# Patient Record
Sex: Female | Born: 1990 | Race: White | Hispanic: No | Marital: Single | Smoking: Never smoker
Health system: Southern US, Community
[De-identification: ages and names within clinical notes are randomized; demographics above are authoritative.]

## PROBLEM LIST (undated history)

## (undated) DIAGNOSIS — E282 Polycystic ovarian syndrome: Secondary | ICD-10-CM

## (undated) DIAGNOSIS — J45909 Unspecified asthma, uncomplicated: Secondary | ICD-10-CM

## (undated) DIAGNOSIS — Z8619 Personal history of other infectious and parasitic diseases: Secondary | ICD-10-CM

## (undated) DIAGNOSIS — R350 Frequency of micturition: Secondary | ICD-10-CM

## (undated) DIAGNOSIS — F419 Anxiety disorder, unspecified: Secondary | ICD-10-CM

## (undated) DIAGNOSIS — T7840XA Allergy, unspecified, initial encounter: Secondary | ICD-10-CM

## (undated) DIAGNOSIS — R011 Cardiac murmur, unspecified: Secondary | ICD-10-CM

## (undated) DIAGNOSIS — N200 Calculus of kidney: Secondary | ICD-10-CM

## (undated) HISTORY — DX: Cardiac murmur, unspecified: R01.1

## (undated) HISTORY — DX: Calculus of kidney: N20.0

## (undated) HISTORY — PX: TONSILLECTOMY: SUR1361

## (undated) HISTORY — DX: Polycystic ovarian syndrome: E28.2

## (undated) HISTORY — DX: Anxiety disorder, unspecified: F41.9

## (undated) HISTORY — PX: TYMPANOSTOMY TUBE PLACEMENT: SHX32

## (undated) HISTORY — DX: Personal history of other infectious and parasitic diseases: Z86.19

## (undated) HISTORY — DX: Allergy, unspecified, initial encounter: T78.40XA

## (undated) HISTORY — DX: Unspecified asthma, uncomplicated: J45.909

## (undated) HISTORY — DX: Frequency of micturition: R35.0

---
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
## 2011-01-07 ENCOUNTER — Ambulatory Visit: Payer: Managed Care, Other (non HMO) | Admitting: Internal Medicine

## 2011-01-14 ENCOUNTER — Ambulatory Visit (INDEPENDENT_AMBULATORY_CARE_PROVIDER_SITE_OTHER): Payer: Managed Care, Other (non HMO) | Admitting: Internal Medicine

## 2011-01-14 ENCOUNTER — Encounter: Payer: Self-pay | Admitting: Internal Medicine

## 2011-01-14 VITALS — BP 120/80 | HR 60 | Ht 69.0 in | Wt 210.0 lb

## 2011-01-14 DIAGNOSIS — E282 Polycystic ovarian syndrome: Secondary | ICD-10-CM

## 2011-01-14 DIAGNOSIS — Z0000 Encounter for general adult medical examination without abnormal findings: Secondary | ICD-10-CM

## 2011-01-14 DIAGNOSIS — E669 Obesity, unspecified: Secondary | ICD-10-CM

## 2011-01-14 DIAGNOSIS — H019 Unspecified inflammation of eyelid: Secondary | ICD-10-CM

## 2011-01-14 DIAGNOSIS — Z23 Encounter for immunization: Secondary | ICD-10-CM

## 2011-01-14 LAB — CBC WITH DIFFERENTIAL/PLATELET
Basophils Relative: 0.8 % (ref 0.0–3.0)
Eosinophils Absolute: 0.1 10*3/uL (ref 0.0–0.7)
Eosinophils Relative: 1.3 % (ref 0.0–5.0)
Hemoglobin: 13.6 g/dL (ref 12.0–15.0)
Lymphocytes Relative: 48.3 % — ABNORMAL HIGH (ref 12.0–46.0)
MCHC: 33.4 g/dL (ref 30.0–36.0)
MCV: 86.1 fl (ref 78.0–100.0)
Neutro Abs: 1.7 10*3/uL (ref 1.4–7.7)
RBC: 4.72 Mil/uL (ref 3.87–5.11)
WBC: 4 10*3/uL — ABNORMAL LOW (ref 4.5–10.5)

## 2011-01-14 LAB — BASIC METABOLIC PANEL
CO2: 28 mEq/L (ref 19–32)
Calcium: 9.7 mg/dL (ref 8.4–10.5)
Chloride: 106 mEq/L (ref 96–112)
Sodium: 145 mEq/L (ref 135–145)

## 2011-01-14 LAB — HEPATIC FUNCTION PANEL
ALT: 17 U/L (ref 0–35)
Albumin: 4.5 g/dL (ref 3.5–5.2)
Alkaline Phosphatase: 43 U/L (ref 39–117)
Total Protein: 7.9 g/dL (ref 6.0–8.3)

## 2011-01-14 LAB — LIPID PANEL
HDL: 81.7 mg/dL (ref >39.00)
Total CHOL/HDL Ratio: 2

## 2011-01-14 LAB — TSH: TSH: 1.23 u[IU]/mL (ref 0.35–5.50)

## 2011-01-14 NOTE — Progress Notes (Signed)
Subjective:    Patient ID: Caroline Washington, female    DOB: [birth year], 20 y.o.   MRN: [id number]  HPI  Patient comes in today for Preventive Health Care visit  No major change in health status since last visit . Sees Caroline Washington for gyne and hormonal therapy. To see derm for left axillary tags. No injury has new small tatoo on wrist. 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 GI /GU: No adominal pain, vomiting, change in bowel habits. No blood in the stool. No significant GU symptoms. SKIN/HEME: ,no acute skin rashes suspicious lesions or bleeding. No lymphadenopathy, nodules, masses.  NEURO/ PSYCH:  No neurologic signs such as weakness numbness No depression anxiety. IMM/ Allergy: No unusual infections.  Marland Kitchen   REST of 12 system review negative Past Medical History  Diagnosis Date  . History of varicella   . PCO (polycystic ovaries)     hair and irrg periods ? [place]  . Urinary frequency 11/07    no cause  . Calcium oxalate renal stones     If if calcium     History   Social History  . Marital Status: Single    Spouse Name: N/A    Number of Children: N/A  . Years of Education: N/A   Occupational History  . Not on file.   Social History Main Topics  . Smoking status: Never Smoker   . Smokeless tobacco: Not on file  . Alcohol Use: No  . Drug Use: No  . Sexually Active: Not on file   Other Topics Concern  . Not on file   Social History Narrative   Father 5'10'Mother 5'9"Caffeine: 1 per day coke or tea. Sweet teaHH of 32 cats and 2 dogsParents: Caroline Washington and Caroline RonaldSleep 7 hoursUNCG   Sports med .  At home .    Past Surgical History  Procedure Date  . Tympanostomy tube placement     Family History  Problem Relation  Age of Onset  . Hypertension Mother   . Depression Mother   . OCD Mother   . Bipolar disorder Mother   . Hypertension Father     No Known Allergies  Current Outpatient Prescriptions on File Prior to Visit  Medication Sig Dispense Refill  . Norethin-Eth Estradiol-Fe [facility] FE) 0.4-35 MG-MCG CHEW Chew by mouth.          BP 120/80  Pulse 60  Ht 5\' 9"  (1.753 m)  Wt 210 lb (95.255 kg)  BMI 31.01 kg/m2  LMP 01/11/2011       Objective:   Physical Exam Wt Readings from Last 3 Encounters:  01/14/11 210 lb (95.255 kg) (98.15%*)  10/08/10 206 lb (93.441 kg) (97.91%*)  05/20/10 212 lb (96.163 kg) (98.27%*)   * Growth percentiles are based on CDC 2-20 Years data.   Physical Exam: Vital signs reviewed NWG:NFAO is a well-developed well-nourished alert cooperative  white female who appears her stated age in no acute distress.  HEENT: normocephalic  traumatic , Eyes: PERRL EOM's full, conjunctiva clear left upper lid medially with some redness  , Nares: paten,t no deformity discharge or tenderness., Ears: no deformity EAC's clear TMs with normal landmarks. Mouth: clear OP, no lesions, edema.  Moist mucous membranes. Dentition in adequate  repair. NECK: supple without masses, thyromegaly or bruits. CHEST/PULM:  Clear to auscultation and percussion breath sounds equal no wheeze , rales or rhonchi. No chest wall deformities or tenderness. CV: PMI is nondisplaced, S1 S2 no gallops, murmurs, rubs. Peripheral pulses are full without delay.No JVD . Breast: normal by inspection . No dimpling, discharge, masses, tenderness or discharge . LN: no cervical axillary inguinal adenopathy ABDOMEN: Bowel sounds normal nontender  No guard or rebound, no hepato splenomegal no CVA tenderness.  No hernia. Extremtities:  No clubbing cyanosis or edema, no acute joint swelling or redness no focal atrophy NEURO:  Oriented x3, cranial nerves 3-12 appear to be intact, no obvious focal weakness,gait within normal  limits no abnormal reflexes or asymmetrical SKIN: No acute rashes normal turgor, color, no bruising or petechiae. Some mild acne face tiny gland/ bump left axilla  No striae PSYCH: Oriented, good eye contact, no obvious depression anxiety, cognition and judgment appear normal.       Assessment & Plan:  Preventive Health Care Counseled regarding healthy nutrition, exercise, sleep, injury prevention, calcium vit d and healthy weight . Flu shot today advised cpx lab   PCO on ocp per Caroline Washington Skin  Concerns   Axillary tag or cyst Weight up some Counseled.  EYE  Lid use compresses 4 x per day and call if  persistent or progressive

## 2011-01-14 NOTE — Patient Instructions (Signed)
Will notify you  of labs when available. Intensify lifestyle interventions. Get sleep. If ok check ina year or as needed.

## 2011-01-14 NOTE — Assessment & Plan Note (Signed)
Recurred but very local  And comes and goes

## 2011-01-15 ENCOUNTER — Encounter: Payer: Self-pay | Admitting: *Deleted

## 2011-01-15 LAB — HIV ANTIBODY (ROUTINE TESTING W REFLEX): HIV: NONREACTIVE

## 2011-01-15 NOTE — Progress Notes (Signed)
Quick Note:    Letter sent to pt.  ______

## 2013-03-01 ENCOUNTER — Encounter (HOSPITAL_BASED_OUTPATIENT_CLINIC_OR_DEPARTMENT_OTHER): Payer: Self-pay | Admitting: Emergency Medicine

## 2013-03-01 ENCOUNTER — Emergency Department (HOSPITAL_BASED_OUTPATIENT_CLINIC_OR_DEPARTMENT_OTHER): Payer: 59

## 2013-03-01 ENCOUNTER — Emergency Department (HOSPITAL_BASED_OUTPATIENT_CLINIC_OR_DEPARTMENT_OTHER)
Admission: EM | Admit: 2013-03-01 | Discharge: 2013-03-01 | Disposition: A | Discharge status: Home / Self Care | Payer: 59 | Attending: Emergency Medicine | Admitting: Emergency Medicine

## 2013-03-01 DIAGNOSIS — M549 Dorsalgia, unspecified: Secondary | ICD-10-CM | POA: Insufficient documentation

## 2013-03-01 DIAGNOSIS — Z8742 Personal history of other diseases of the female genital tract: Secondary | ICD-10-CM | POA: Insufficient documentation

## 2013-03-01 DIAGNOSIS — Z3202 Encounter for pregnancy test, result negative: Secondary | ICD-10-CM | POA: Insufficient documentation

## 2013-03-01 DIAGNOSIS — Z8619 Personal history of other infectious and parasitic diseases: Secondary | ICD-10-CM | POA: Insufficient documentation

## 2013-03-01 DIAGNOSIS — R109 Unspecified abdominal pain: Secondary | ICD-10-CM | POA: Insufficient documentation

## 2013-03-01 DIAGNOSIS — Z87442 Personal history of urinary calculi: Secondary | ICD-10-CM | POA: Insufficient documentation

## 2013-03-01 LAB — URINALYSIS, ROUTINE W REFLEX MICROSCOPIC
BILIRUBIN URINE: NEGATIVE
GLUCOSE, UA: NEGATIVE mg/dL
KETONES UR: NEGATIVE mg/dL
Leukocytes, UA: NEGATIVE
Nitrite: NEGATIVE
PH: 6 (ref 5.0–8.0)
Protein, ur: NEGATIVE mg/dL
Specific Gravity, Urine: 1.03 (ref 1.005–1.030)
Urobilinogen, UA: 0.2 mg/dL (ref 0.0–1.0)

## 2013-03-01 LAB — URINE MICROSCOPIC-ADD ON

## 2013-03-01 LAB — PREGNANCY, URINE: Preg Test, Ur: NEGATIVE

## 2013-03-01 MED ORDER — OXYCODONE-ACETAMINOPHEN 5-325 MG PO TABS
2.0000 | ORAL_TABLET | Freq: Once | ORAL | Status: AC
  Administered 2013-03-01: 2 via ORAL
  Filled 2013-03-01: qty 2

## 2013-03-01 MED ORDER — ONDANSETRON 4 MG PO TBDP
4.0000 mg | ORAL_TABLET | Freq: Once | ORAL | Status: AC
  Administered 2013-03-01: 4 mg via ORAL
  Filled 2013-03-01: qty 1

## 2013-03-01 MED ORDER — METHOCARBAMOL 500 MG PO TABS: 500.0000 mg | ORAL_TABLET | Freq: Two times a day (BID) | ORAL | Status: DC

## 2013-03-01 MED ORDER — OXYCODONE-ACETAMINOPHEN 5-325 MG PO TABS: 2.0000 | ORAL_TABLET | ORAL | Status: DC | PRN

## 2013-03-01 NOTE — ED Provider Notes (Signed)
CSN: [id number]     Arrival date & time 03/01/13  1714 History   First MD Initiated Contact with Patient 03/01/13 1746     Chief Complaint  Patient presents with  . Flank Pain   (Consider location/radiation/quality/duration/timing/severity/associated sxs/prior Treatment) Patient is a 23 y.o. female presenting with back pain. The history is provided by the patient. No language interpreter was used.  Back Pain Location:  Generalized Quality:  Aching Radiates to:  Does not radiate Pain severity:  Severe Pain is:  Same all the time Onset quality:  Gradual Timing:  Constant Progression:  Worsening Chronicity:  New Relieved by:  Nothing Worsened by:  Nothing tried Associated symptoms: abdominal pain   Pt complains of pain in her back.   Pt reports pain feels like kidney stone she has had in the past  Past Medical History  Diagnosis Date  . History of varicella   . PCO (polycystic ovaries)     hair and irrg periods ? [place]  . Urinary frequency 11/07    no cause  . Calcium oxalate renal stones     If if calcium    Past Surgical History  Procedure Laterality Date  . Tympanostomy tube placement     Family History  Problem Relation Age of Onset  . Hypertension Mother   . Depression Mother   . OCD Mother   . Bipolar disorder Mother   . Hypertension Father    History  Substance Use Topics  . Smoking status: Never Smoker   . Smokeless tobacco: Not on file  . Alcohol Use: No   OB History   Grav Para Term Preterm Abortions TAB SAB Ect Mult Living                 Review of Systems  Gastrointestinal: Positive for abdominal pain.  Musculoskeletal: Positive for back pain.  All other systems reviewed and are negative.    Allergies  Review of patient's allergies indicates no known allergies.  Home Medications   Current Outpatient Rx  Name  Route  Sig  Dispense  Refill  . Norethin-Eth Estradiol-Fe [facility](FEMCON FE) 0.4-35 MG-MCG CHEW   Oral   Chew by mouth.            BP  104/66  Pulse 74  Temp(Src) 97.8 F (36.6 C) (Oral)  Resp 20  Ht 5\' 9"  (1.753 m)  Wt 210 lb (95.255 kg)  BMI 31.00 kg/m2  SpO2 100%  LMP 02/15/2013 Physical Exam  Nursing note and vitals reviewed. Constitutional: She is oriented to person, place, and time.  HENT:  Head: Normocephalic.  Right Ear: External ear normal.  Left Ear: External ear normal.  Mouth/Throat: Oropharynx is clear and moist.  Eyes: Conjunctivae are normal. Pupils are equal, round, and reactive to light.  Neck: Normal range of motion. Neck supple.  Cardiovascular: Normal rate.   Pulmonary/Chest: Effort normal and breath sounds normal.  Abdominal: Soft. Bowel sounds are normal.  Neurological: She is alert and oriented to person, place, and time. She has normal reflexes.  Skin: Skin is warm.  Psychiatric: She has a normal mood and affect.    ED Course  Procedures (including critical care time) Labs Review Labs Reviewed  URINALYSIS, ROUTINE W REFLEX MICROSCOPIC - Abnormal; Notable for the following:    Color, Urine AMBER (*)    Hgb urine dipstick MODERATE (*)    All other components within normal limits  PREGNANCY, URINE  URINE MICROSCOPIC-ADD ON   Imaging Review No results  found.  EKG Interpretation   None       MDM   1. Back pain    Ct reviewed with pt.   I will treat pain.       Lonia Skinner Eskridge, PA-C 03/02/13 1735

## 2013-03-01 NOTE — ED Notes (Signed)
Left flank pain for about a week but worse tonight. Hx of kidney stones. This pain feels the same.

## 2013-03-01 NOTE — Discharge Instructions (Signed)
Back Exercises °Back exercises help treat and prevent back injuries. The goal of back exercises is to increase the strength of your abdominal and back muscles and the flexibility of your back. These exercises should be started when you no longer have back pain. Back exercises include: °· Pelvic Tilt. Lie on your back with your knees bent. Tilt your pelvis until the lower part of your back is against the floor. Hold this position 5 to 10 sec and repeat 5 to 10 times. °· Knee to Chest. Pull first 1 knee up against your chest and hold for 20 to 30 seconds, repeat this with the other knee, and then both knees. This may be done with the other leg straight or bent, whichever feels better. °· Sit-Ups or Curl-Ups. Bend your knees 90 degrees. Start with tilting your pelvis, and do a partial, slow sit-up, lifting your trunk only 30 to 45 degrees off the floor. Take at least 2 to 3 seconds for each sit-up. Do not do sit-ups with your knees out straight. If partial sit-ups are difficult, simply do the above but with only tightening your abdominal muscles and holding it as directed. °· Hip-Lift. Lie on your back with your knees flexed 90 degrees. Push down with your feet and shoulders as you raise your hips a couple inches off the floor; hold for 10 seconds, repeat 5 to 10 times. °· Back arches. Lie on your stomach, propping yourself up on bent elbows. Slowly press on your hands, causing an arch in your low back. Repeat 3 to 5 times. Any initial stiffness and discomfort should lessen with repetition over time. °· Shoulder-Lifts. Lie face down with arms beside your body. Keep hips and torso pressed to floor as you slowly lift your head and shoulders off the floor. °Do not overdo your exercises, especially in the beginning. Exercises may cause you some mild back discomfort which lasts for a few minutes; however, if the pain is more severe, or lasts for more than 15 minutes, do not continue exercises until you see your caregiver.  Improvement with exercise therapy for back problems is slow.  °See your caregivers for assistance with developing a proper back exercise program. °Document Released: 03/06/2004 Document Revised: 04/21/2011 Document Reviewed: 11/28/2010 °ExitCare® Patient Information ©2014 [employer]. ° °Back Pain, Adult °Low back pain is very common. About 1 in 5 people have back pain. The cause of low back pain is rarely dangerous. The pain often gets better over time. About half of people with a sudden onset of back pain feel better in just 2 weeks. About 8 in 10 people feel better by 6 weeks.  °CAUSES °Some common causes of back pain include: °· Strain of the muscles or ligaments supporting the spine. °· Wear and tear (degeneration) of the spinal discs. °· Arthritis. °· Direct injury to the back. °DIAGNOSIS °Most of the time, the direct cause of low back pain is not known. However, back pain can be treated effectively even when the exact cause of the pain is unknown. Answering your caregiver's questions about your overall health and symptoms is one of the most accurate ways to make sure the cause of your pain is not dangerous. If your caregiver needs more information, he or she may order lab work or imaging tests (X-rays or MRIs). However, even if imaging tests show changes in your back, this usually does not require surgery. °HOME CARE INSTRUCTIONS °For many people, back pain returns. Since low back pain is rarely dangerous, it is often a condition that people   can learn to manage on their own.  °· Remain active. It is stressful on the back to sit or stand in one place. Do not sit, drive, or stand in one place for more than 30 minutes at a time. Take short walks on level surfaces as soon as pain allows. Try to increase the length of time you walk each day. °· Do not stay in bed. Resting more than 1 or 2 days can delay your recovery. °· Do not avoid exercise or work. Your body is made to move. It is not dangerous to be active,  even though your back may hurt. Your back will likely heal faster if you return to being active before your pain is gone. °· Pay attention to your body when you  bend and lift. Many people have less discomfort when lifting if they bend their knees, keep the load close to their bodies, and avoid twisting. Often, the most comfortable positions are those that put less stress on your recovering back. °· Find a comfortable position to sleep. Use a firm mattress and lie on your side with your knees slightly bent. If you lie on your back, put a pillow under your knees. °· Only take over-the-counter or prescription medicines as directed by your caregiver. Over-the-counter medicines to reduce pain and inflammation are often the most helpful. Your caregiver may prescribe muscle relaxant drugs. These medicines help dull your pain so you can more quickly return to your normal activities and healthy exercise. °· Put ice on the injured area. °· Put ice in a plastic bag. °· Place a towel between your skin and the bag. °· Leave the ice on for 15-20 minutes, 03-04 times a day for the first 2 to 3 days. After that, ice and heat may be alternated to reduce pain and spasms. °· Ask your caregiver about trying back exercises and gentle massage. This may be of some benefit. °· Avoid feeling anxious or stressed. Stress increases muscle tension and can worsen back pain. It is important to recognize when you are anxious or stressed and learn ways to manage it. Exercise is a great option. °SEEK MEDICAL CARE IF: °· You have pain that is not relieved with rest or medicine. °· You have pain that does not improve in 1 week. °· You have new symptoms. °· You are generally not feeling well. °SEEK IMMEDIATE MEDICAL CARE IF:  °· You have pain that radiates from your back into your legs. °· You develop new bowel or bladder control problems. °· You have unusual weakness or numbness in your arms or legs. °· You develop nausea or vomiting. °· You develop  abdominal pain. °· You feel faint. °Document Released: 01/27/2005 Document Revised: 07/29/2011 Document Reviewed: 06/17/2010 °ExitCare® Patient Information ©2014 [employer]. ° °

## 2013-03-02 ENCOUNTER — Emergency Department (HOSPITAL_COMMUNITY)
Admission: EM | Admit: 2013-03-02 | Discharge: 2013-03-02 | Disposition: A | Discharge status: Home / Self Care | Payer: 59 | Attending: Emergency Medicine | Admitting: Emergency Medicine

## 2013-03-02 ENCOUNTER — Emergency Department (HOSPITAL_COMMUNITY): Payer: 59

## 2013-03-02 ENCOUNTER — Encounter (HOSPITAL_COMMUNITY): Payer: Self-pay | Admitting: Emergency Medicine

## 2013-03-02 DIAGNOSIS — R112 Nausea with vomiting, unspecified: Secondary | ICD-10-CM | POA: Insufficient documentation

## 2013-03-02 DIAGNOSIS — Z8619 Personal history of other infectious and parasitic diseases: Secondary | ICD-10-CM | POA: Insufficient documentation

## 2013-03-02 DIAGNOSIS — R630 Anorexia: Secondary | ICD-10-CM | POA: Insufficient documentation

## 2013-03-02 DIAGNOSIS — Z79899 Other long term (current) drug therapy: Secondary | ICD-10-CM | POA: Insufficient documentation

## 2013-03-02 DIAGNOSIS — Z8742 Personal history of other diseases of the female genital tract: Secondary | ICD-10-CM | POA: Insufficient documentation

## 2013-03-02 DIAGNOSIS — Z87442 Personal history of urinary calculi: Secondary | ICD-10-CM | POA: Insufficient documentation

## 2013-03-02 DIAGNOSIS — E669 Obesity, unspecified: Secondary | ICD-10-CM | POA: Insufficient documentation

## 2013-03-02 DIAGNOSIS — R109 Unspecified abdominal pain: Secondary | ICD-10-CM

## 2013-03-02 LAB — CBC WITH DIFFERENTIAL/PLATELET
BASOS PCT: 0 % (ref 0–1)
Basophils Absolute: 0 10*3/uL (ref 0.0–0.1)
Eosinophils Absolute: 0 10*3/uL (ref 0.0–0.7)
Eosinophils Relative: 0 % (ref 0–5)
HCT: 39.1 % (ref 36.0–46.0)
HEMOGLOBIN: 13 g/dL (ref 12.0–15.0)
LYMPHS ABS: 0.9 10*3/uL (ref 0.7–4.0)
Lymphocytes Relative: 10 % — ABNORMAL LOW (ref 12–46)
MCH: 28.3 pg (ref 26.0–34.0)
MCHC: 33.2 g/dL (ref 30.0–36.0)
MCV: 85 fL (ref 78.0–100.0)
MONOS PCT: 3 % (ref 3–12)
Monocytes Absolute: 0.2 10*3/uL (ref 0.1–1.0)
NEUTROS ABS: 7.4 10*3/uL (ref 1.7–7.7)
NEUTROS PCT: 87 % — AB (ref 43–77)
Platelets: 238 10*3/uL (ref 150–400)
RBC: 4.6 MIL/uL (ref 3.87–5.11)
RDW: 13.9 % (ref 11.5–15.5)
WBC: 8.4 10*3/uL (ref 4.0–10.5)

## 2013-03-02 LAB — BASIC METABOLIC PANEL
BUN: 12 mg/dL (ref 6–23)
CHLORIDE: 102 meq/L (ref 96–112)
CO2: 22 mEq/L (ref 19–32)
Calcium: 9.7 mg/dL (ref 8.4–10.5)
Creatinine, Ser: 0.8 mg/dL (ref 0.50–1.10)
GFR calc non Af Amer: >90 mL/min (ref >90)
Glucose, Bld: 120 mg/dL — ABNORMAL HIGH (ref 70–99)
POTASSIUM: 4.3 meq/L (ref 3.7–5.3)
Sodium: 139 mEq/L (ref 137–147)

## 2013-03-02 MED ORDER — HYDROMORPHONE HCL PF 1 MG/ML IJ SOLN: 0.5000 mg | Freq: Once | INTRAMUSCULAR | Status: DC

## 2013-03-02 MED ORDER — HYDROMORPHONE HCL PF 1 MG/ML IJ SOLN
1.0000 mg | Freq: Once | INTRAMUSCULAR | Status: AC
  Administered 2013-03-02: 1 mg via INTRAVENOUS
  Filled 2013-03-02: qty 1

## 2013-03-02 MED ORDER — MORPHINE SULFATE 4 MG/ML IJ SOLN
4.0000 mg | Freq: Once | INTRAMUSCULAR | Status: AC
  Administered 2013-03-02: 4 mg via INTRAVENOUS
  Filled 2013-03-02: qty 1

## 2013-03-02 MED ORDER — SODIUM CHLORIDE 0.9 % IV BOLUS (SEPSIS)
1000.0000 mL | Freq: Once | INTRAVENOUS | Status: AC
  Administered 2013-03-02: 1000 mL via INTRAVENOUS

## 2013-03-02 MED ORDER — ONDANSETRON HCL 4 MG/2ML IJ SOLN
4.0000 mg | INTRAMUSCULAR | Status: AC
  Administered 2013-03-02: 4 mg via INTRAVENOUS
  Filled 2013-03-02: qty 2

## 2013-03-02 MED ORDER — KETOROLAC TROMETHAMINE 30 MG/ML IJ SOLN
30.0000 mg | Freq: Once | INTRAMUSCULAR | Status: AC
  Administered 2013-03-02: 30 mg via INTRAVENOUS
  Filled 2013-03-02: qty 1

## 2013-03-02 NOTE — ED Provider Notes (Signed)
Patient signed out to me to followup on ultrasound. Patient had been evaluated at [facility] yesterday for left-sided flank pain. Workup is negative, patient prescribed analgesia. Ultrasound of the pelvis with Doppler was performed to rule out ovarian torsion. This test was entirely normal. No adnexal abnormalities including no signs of torsion or ovarian cyst. CT scan performed yesterday was also negative. Cause of the patient's pain is unclear at this time. Blood work entirely normal including normal white count of 8.4.  Patient was reexamined. She is more comfortable but has some residual pain. Discussing the workup and presentation with the patient and her family, this really seems similar to previous kidney stone she has had. CT scan yesterday did show punctate stones in the left kidney. I feel like this is possibly renal colic, without obvious hydronephrosis or ureteral stones seen on the scan. Patient has followup on Monday with her urologist as scheduled.  She has not had any pelvic complaints. As the ultrasound was normal, she does to discharge, I have low suspicion for PID or other abnormalities. I did consider pelvic exam, but after discussing her current presentation with primarily back and left flank pain, have opted to defer the exam.  Gilda Creasehristopher J. Dorianna Mckiver, MD 03/02/13 [phone]

## 2013-03-02 NOTE — ED Notes (Signed)
Patient transported to Ultrasound 

## 2013-03-02 NOTE — ED Provider Notes (Signed)
MSE was initiated and I personally evaluated the patient and placed orders (if any) at  5:32 AM on March 02, 2013.  Patient is a 23 year old female with a history of PCOS and kidney stones who presents today for persistent left flank pain. [facility] with a CT abdomen pelvis and urinalysis as well as urine pregnancy. CT at this time is negative for ureteral calculi. Urine did not suggest infection and urine pregnancy negative. Patient was discharged home with Percocet and Robaxin for symptoms. Patient has been taking medication as prescribed with no improvement in her left flank pain. She is also experienced numerous episodes of nonbloody, nonbilious emesis since discharge yesterday.  On my exam patient has some focal tenderness in the left suprapubic region. No evidence of acute surgical abdomen on exam. She is hemodynamically stable. Given her return and worsening symptoms, not controlled by oral narcotics, will further evaluate for pelvic etiology with pelvic ultrasound; specifically to r/o ovarian torsion and to evaluate for ruptured ovarian cyst. CBC and BMP ordered as well as IV fluids, morphine, and Zofran.  The patient appears stable so that the remainder of the MSE may be completed by another provider.  Antony MaduraKelly Tommy Goostree, PA-C 03/02/13 [phone]

## 2013-03-02 NOTE — ED Provider Notes (Signed)
CSN: [id number]     Arrival date & time 03/02/13  0303 History   First MD Initiated Contact with Patient 03/02/13 0539     Chief Complaint  Patient presents with  . Flank Pain   (Consider location/radiation/quality/duration/timing/severity/associated sxs/prior Treatment) HPI Comments: 23 yo female with PCOS, obesity presents with left flank pain, recurrent for a few days.  Pt was seen at [facility], had CT scan stone study, no acute findings for her pain.  Despite pain meds pain worsening, left lower flank.  No vaginal sxs.  Different than previous.  Sent to [facility] for [place] to look for cyst or torsion.  Nothing improves pain.   Patient is a 23 y.o. female presenting with flank pain. The history is provided by the patient.  Flank Pain Associated symptoms include abdominal pain. Pertinent negatives include no chest pain, no headaches and no shortness of breath.    Past Medical History  Diagnosis Date  . History of varicella   . PCO (polycystic ovaries)     hair and irrg periods ? [place]  . Urinary frequency 11/07    no cause  . Calcium oxalate renal stones     If if calcium    Past Surgical History  Procedure Laterality Date  . Tympanostomy tube placement     Family History  Problem Relation Age of Onset  . Hypertension Mother   . Depression Mother   . OCD Mother   . Bipolar disorder Mother   . Hypertension Father    History  Substance Use Topics  . Smoking status: Never Smoker   . Smokeless tobacco: Not on file  . Alcohol Use: No   OB History   Grav Para Term Preterm Abortions TAB SAB Ect Mult Living                 Review of Systems  Constitutional: Positive for appetite change. Negative for fever and chills.  HENT: Negative for congestion.   Eyes: Negative for visual disturbance.  Respiratory: Negative for shortness of breath.   Cardiovascular: Negative for chest pain.  Gastrointestinal: Positive for nausea, vomiting and abdominal pain.  Genitourinary: Positive for  flank pain. Negative for dysuria and vaginal bleeding.  Musculoskeletal: Negative for back pain, neck pain and neck stiffness.  Skin: Negative for rash.  Neurological: Negative for light-headedness and headaches.    Allergies  Review of patient's allergies indicates no known allergies.  Home Medications   Current Outpatient Rx  Name  Route  Sig  Dispense  Refill  . Norethin-Eth Estrad-Fe [facility] (LO LOESTRIN FE PO)   Oral   Take 1 tablet by mouth daily.         . methocarbamol (ROBAXIN) 500 MG tablet   Oral   Take 1 tablet (500 mg total) by mouth 2 (two) times daily.   20 tablet   0   . oxyCODONE-acetaminophen (PERCOCET/ROXICET) 5-325 MG per tablet   Oral   Take 2 tablets by mouth every 4 (four) hours as needed for severe pain.   15 tablet   0    BP 121/61  Pulse 73  Temp(Src) 98.3 F (36.8 C) (Oral)  Resp 18  Wt 245 lb 8 oz (111.358 kg)  SpO2 99%  LMP 02/15/2013 Physical Exam  Nursing note and vitals reviewed. Constitutional: She is oriented to person, place, and time. She appears well-developed and well-nourished.  HENT:  Head: Normocephalic and atraumatic.  Mild dry mm  Eyes: Conjunctivae are normal. Right eye exhibits no  discharge. Left eye exhibits no discharge.  Neck: Normal range of motion. Neck supple. No tracheal deviation present.  Cardiovascular: Normal rate and regular rhythm.   Pulmonary/Chest: Effort normal and breath sounds normal.  Abdominal: Soft. She exhibits no distension. There is tenderness (left lower quadrant). There is no guarding.  Musculoskeletal: She exhibits no edema.  Neurological: She is alert and oriented to person, place, and time.  Skin: Skin is warm. No rash noted.  Psychiatric: She has a normal mood and affect.    ED Course  Procedures (including critical care time) Labs Review Labs Reviewed  CBC WITH DIFFERENTIAL - Abnormal; Notable for the following:    Neutrophils Relative % 87 (*)    Lymphocytes Relative 10 (*)     All other components within normal limits  BASIC METABOLIC PANEL - Abnormal; Notable for the following:    Glucose, Bld 120 (*)    All other components within normal limits   Imaging Review Ct Abdomen Pelvis Wo Contrast  03/01/2013   CLINICAL DATA:  Left flank pain, history of renal calculi  EXAM: CT ABDOMEN AND PELVIS WITHOUT CONTRAST  TECHNIQUE: Multidetector CT imaging of the abdomen and pelvis was performed following the standard protocol without IV contrast.  COMPARISON:  04/03/2010  FINDINGS: Minor left base atelectasis. Lung bases otherwise clear. No lower lobe pneumonia, collapse or consolidation. Normal heart size. No pericardial or pleural effusion.  Abdomen: Kidneys demonstrate no acute obstruction, hydronephrosis, or perinephric inflammatory process. Punctate subcentimeter nonobstructing intrarenal calculus in the left kidney midpole noted. Ureters are decompressed and symmetric. No definite obstructing ureteral calculus on either side.  Liver, gallbladder, biliary system, pancreas, spleen, and adrenal glands are within normal limits for a noncontrast study.  Negative for bowel obstruction, dilatation, ileus, or free air.  No abdominal free fluid, fluid collection, hemorrhage, abscess, or adenopathy. Normal appendix.  Pelvis: No pelvic free fluid, fluid collection, hemorrhage, abscess, or adenopathy. No inguinal abnormality, or hernia. Urinary bladder is collapsed. The uterus is normal in size. No definite adnexal enlargement or abnormality. Pelvic calcifications consistent with venous phleboliths.  No acute osseous finding.  IMPRESSION: Nonobstructing punctate tiny left intrarenal calculus.  No acute obstructive uropathy, obstructing ureteral calculus or hydronephrosis on either side.  No acute intra-abdominal or pelvic finding by noncontrast CT.   Electronically Signed   By: Ruel Favorsrevor  Shick M.D.   On: 03/01/2013 19:25    EKG Interpretation   None       MDM  No diagnosis found. Fluids,  pain meds given.  UA and labs unremarkable.   Pt unable to have US due to bladder empty, sent back down for more fluids. IV fluids given.  US pending, signed out to fup results and dispo, likely outpt to OB/ GYN. Discussed plan with mother and pt.   Left flank pain, PCOS, Vomiting, Dehydration    Enid SkeensJoshua M Mariko Nowakowski, MD 03/02/13 [phone]

## 2013-03-02 NOTE — ED Provider Notes (Signed)
Medical screening examination/treatment/procedure(s) were conducted as a shared visit with non-physician practitioner(s) or resident  and myself.  I personally evaluated the patient during the encounter and agree with the findings and plan unless otherwise indicated.    I have personally reviewed any xrays and/ or EKG's with the provider and I agree with interpretation.   See separate note.    Enid SkeensJoshua M Ruairi Stutsman, MD 03/02/13 [phone]

## 2013-03-02 NOTE — ED Notes (Signed)
Pt. reports persistent left flank pain with nausea, seen at [facility] this evening discharge home with prescription Percocet and robaxin - CT scan and urine test done at [facility] .

## 2013-03-02 NOTE — Discharge Instructions (Signed)

## 2013-03-04 NOTE — ED Provider Notes (Signed)
Medical screening examination/treatment/procedure(s) were performed by non-physician practitioner and as supervising physician I was immediately available for consultation/collaboration.     Geoffery Lyonsouglas Audra Kagel, MD 03/04/13 1536

## 2013-12-13 ENCOUNTER — Ambulatory Visit (INDEPENDENT_AMBULATORY_CARE_PROVIDER_SITE_OTHER): Payer: 59 | Admitting: Family Medicine

## 2013-12-13 DIAGNOSIS — Z23 Encounter for immunization: Secondary | ICD-10-CM

## 2014-01-27 ENCOUNTER — Ambulatory Visit (INDEPENDENT_AMBULATORY_CARE_PROVIDER_SITE_OTHER): Payer: 59 | Admitting: Physician Assistant

## 2014-01-27 VITALS — BP 118/78 | HR 72 | Temp 98.4°F | Resp 16 | Ht 69.0 in | Wt 254.2 lb

## 2014-01-27 DIAGNOSIS — M545 Low back pain, unspecified: Secondary | ICD-10-CM

## 2014-01-27 DIAGNOSIS — J029 Acute pharyngitis, unspecified: Secondary | ICD-10-CM

## 2014-01-27 LAB — POCT RAPID STREP A (OFFICE): Rapid Strep A Screen: NEGATIVE

## 2014-01-27 MED ORDER — FIRST-DUKES MOUTHWASH MT SUSP: 5.0000 mL | OROMUCOSAL | Status: DC | PRN

## 2014-01-27 MED ORDER — CYCLOBENZAPRINE HCL 5 MG PO TABS: 5.0000 mg | ORAL_TABLET | Freq: Three times a day (TID) | ORAL | Status: DC | PRN

## 2014-01-27 MED ORDER — IBUPROFEN 600 MG PO TABS: 600.0000 mg | ORAL_TABLET | Freq: Three times a day (TID) | ORAL | Status: DC | PRN

## 2014-01-27 NOTE — Patient Instructions (Addendum)
Back Pain, Adult Low back pain is very common. About 1 in 5 people have back pain.The cause of low back pain is rarely dangerous. The pain often gets better over time.About half of people with a sudden onset of back pain feel better in just 2 weeks. About 8 in 10 people feel better by 6 weeks.  CAUSES Some common causes of back pain include:  Strain of the muscles or ligaments supporting the spine.  Wear and tear (degeneration) of the spinal discs.  Arthritis.  Direct injury to the back. DIAGNOSIS Most of the time, the direct cause of low back pain is not known.However, back pain can be treated effectively even when the exact cause of the pain is unknown.Answering your caregiver's questions about your overall health and symptoms is one of the most accurate ways to make sure the cause of your pain is not dangerous. If your caregiver needs more information, he or she may order lab work or imaging tests (X-rays or MRIs).However, even if imaging tests show changes in your back, this usually does not require surgery. HOME CARE INSTRUCTIONS For many people, back pain returns.Since low back pain is rarely dangerous, it is often a condition that people can learn to manageon their own.   Remain active. It is stressful on the back to sit or stand in one place. Do not sit, drive, or stand in one place for more than 30 minutes at a time. Take short walks on level surfaces as soon as pain allows.Try to increase the length of time you walk each day.  Do not stay in bed.Resting more than 1 or 2 days can delay your recovery.  Do not avoid exercise or work.Your body is made to move.It is not dangerous to be active, even though your back may hurt.Your back will likely heal faster if you return to being active before your pain is gone.  Pay attention to your body when you bend and lift. Many people have less discomfortwhen lifting if they bend their knees, keep the load close to their bodies,and  avoid twisting. Often, the most comfortable positions are those that put less stress on your recovering back.  Find a comfortable position to sleep. Use a firm mattress and lie on your side with your knees slightly bent. If you lie on your back, put a pillow under your knees.  Only take over-the-counter or prescription medicines as directed by your caregiver. Over-the-counter medicines to reduce pain and inflammation are often the most helpful.Your caregiver may prescribe muscle relaxant drugs.These medicines help dull your pain so you can more quickly return to your normal activities and healthy exercise.  Put ice on the injured area.  Put ice in a plastic bag.  Place a towel between your skin and the bag.  Leave the ice on for 15-20 minutes, 03-04 times a day for the first 2 to 3 days. After that, ice and heat may be alternated to reduce pain and spasms.  Ask your caregiver about trying back exercises and gentle massage. This may be of some benefit.  Avoid feeling anxious or stressed.Stress increases muscle tension and can worsen back pain.It is important to recognize when you are anxious or stressed and learn ways to manage it.Exercise is a great option. SEEK MEDICAL CARE IF:  You have pain that is not relieved with rest or medicine.  You have pain that does not improve in 1 week.  You have new symptoms.  You are generally not feeling well. SEEK   IMMEDIATE MEDICAL CARE IF:   You have pain that radiates from your back into your legs.  You develop new bowel or bladder control problems.  You have unusual weakness or numbness in your arms or legs.  You develop nausea or vomiting.  You develop abdominal pain.  You feel faint. Document Released: 01/27/2005 Document Revised: 07/29/2011 Document Reviewed: 05/31/2013 ExitCare Patient Information 2015 [employer]. This information is not intended to replace advice given to you by your health care provider. Make sure you  discuss any questions you have with your health care provider.  

## 2014-01-27 NOTE — Progress Notes (Signed)
Subjective:    Patient ID: Caroline PalmaHaley A Asfaw, female    DOB: [birth year], 23 y.o.   MRN: [id number]  HPI Patient presents for 4 days of right lower back pain and sore throat for 1 day. Got a new puppy and has been sleeping on recliner for convenience to taking the puppy outside. Pain does not radiate and is sharp in quality. Was initially 2/10 on pain scale, but is now 6/10. Denies loss of function, ROM, or sensation and no incontinence or urinary/bowel sx. Denies trauma or falls.  Sore throat started last night, but woke up to more intense pain. Had temp of 99.5 degrees. Has swollen swollen nodes and headache. Denies cough, SOB/CP, or congestion. Able to eat without difficulty.   Review of Systems  Constitutional: Positive for fever, appetite change and fatigue. Negative for chills.  HENT: Positive for sore throat. Negative for congestion, ear discharge, ear pain, hearing loss, rhinorrhea and sinus pressure.   Eyes: Negative for pain.  Respiratory: Negative for shortness of breath.   Cardiovascular: Negative for chest pain.  Gastrointestinal: Negative for nausea, vomiting and constipation.  Genitourinary: Negative for dysuria and frequency.  Musculoskeletal: Positive for back pain. Negative for joint swelling, gait problem, neck pain and neck stiffness.  Skin: Negative for rash.  Neurological: Positive for headaches. Negative for light-headedness.  Hematological: Positive for adenopathy.       Objective:   Physical Exam  Constitutional: She is oriented to person, place, and time. She appears well-developed and well-nourished. No distress.  Blood pressure 118/78, pulse 72, temperature 98.4 F (36.9 C), temperature source Oral, resp. rate 16, height 5\' 9"  (1.753 m), weight 254 lb 3.2 oz (115.304 kg), last menstrual period 12/28/2013, SpO2 99 %.  HENT:  Head: Normocephalic and atraumatic.  Right Ear: External ear normal. A foreign body (impacted cerumen) is present.  Left Ear: Tympanic  membrane, external ear and ear canal normal.  Nose: Nose normal. No mucosal edema or rhinorrhea. Right sinus exhibits no maxillary sinus tenderness and no frontal sinus tenderness. Left sinus exhibits no maxillary sinus tenderness and no frontal sinus tenderness.  Mouth/Throat: Uvula is midline and mucous membranes are normal. Posterior oropharyngeal erythema present. No oropharyngeal exudate, posterior oropharyngeal edema or tonsillar abscesses.  2+ hypertrophic tonsils  Eyes: Conjunctivae are normal. Pupils are equal, round, and reactive to light. Right eye exhibits no discharge. Left eye exhibits no discharge. No scleral icterus.  Neck: Normal range of motion. Neck supple. No thyromegaly present.  Cardiovascular: Normal rate, regular rhythm and normal heart sounds.  Exam reveals no gallop and no friction rub.   No murmur heard. Pulmonary/Chest: Effort normal and breath sounds normal. No respiratory distress. She has no wheezes. She has no rales. She exhibits no tenderness.  Abdominal: Soft. Bowel sounds are normal. There is no tenderness.  Musculoskeletal: Normal range of motion. She exhibits no tenderness.  Lymphadenopathy:    She has cervical adenopathy.  Neurological: She is alert and oriented to person, place, and time. She has normal reflexes. No cranial nerve deficit. She exhibits normal muscle tone. Coordination normal.  Skin: Skin is warm and dry. No rash noted. She is not diaphoretic. No erythema. No pallor.   Results for orders placed or performed in visit on 01/27/14  POCT rapid strep A  Result Value Ref Range   Rapid Strep A Screen Negative Negative        Assessment & Plan:  1. Right-sided low back pain without sciatica Alternate ice and heating pad.  -  ibuprofen (ADVIL,MOTRIN) 600 MG tablet; Take 1 tablet (600 mg total) by mouth every 8 (eight) hours as needed.  Dispense: 30 tablet; Refill: 0 - cyclobenzaprine (FLEXERIL) 5 MG tablet; Take 1 tablet (5 mg total) by mouth 3  (three) times daily as needed for muscle spasms.  Dispense: 30 tablet; Refill: 0  2. Sore throat - POCT rapid strep A - Culture, Group A Strep - Diphenhyd-Hydrocort-Nystatin (FIRST-DUKES MOUTHWASH) SUSP; Use as directed 5 mLs in the mouth or throat every 2 (two) hours as needed. Mix with 2% visc. Lido, 1:1 ratio  Dispense: 120 mL; Refill: 0   Braun Rocca PA-C  [facility] 01/27/2014 11:05 AM

## 2014-01-27 NOTE — Progress Notes (Signed)
The patient was discussed with me and I agree with the diagnosis and treatment plan.  

## 2014-01-29 LAB — CULTURE, GROUP A STREP: Organism ID, Bacteria: NORMAL

## 2014-02-01 ENCOUNTER — Encounter: Payer: Self-pay | Admitting: Physician Assistant

## 2014-07-18 ENCOUNTER — Telehealth: Payer: Self-pay | Admitting: Internal Medicine

## 2014-07-18 NOTE — Telephone Encounter (Signed)
Pt not seen by dr Fabian Sharppanosh since 2012.  [place] is flying to [place] on [place]24 and wants t see the dr to get a low dose anxiety med.   Is it ok to schedule this pt w/ dr Fabian Sharppanosh?

## 2014-07-18 NOTE — Telephone Encounter (Signed)
Ok   2 slots   Leave some sdas open.

## 2014-07-19 NOTE — Telephone Encounter (Signed)
done

## 2014-07-31 ENCOUNTER — Ambulatory Visit (INDEPENDENT_AMBULATORY_CARE_PROVIDER_SITE_OTHER): Payer: 59 | Admitting: Internal Medicine

## 2014-07-31 ENCOUNTER — Encounter: Payer: Self-pay | Admitting: Internal Medicine

## 2014-07-31 ENCOUNTER — Ambulatory Visit: Payer: 59 | Admitting: Internal Medicine

## 2014-07-31 VITALS — BP 106/74 | Temp 98.0°F | Ht 68.25 in | Wt 257.2 lb

## 2014-07-31 DIAGNOSIS — Z87442 Personal history of urinary calculi: Secondary | ICD-10-CM | POA: Diagnosis not present

## 2014-07-31 DIAGNOSIS — F418 Other specified anxiety disorders: Secondary | ICD-10-CM | POA: Diagnosis not present

## 2014-07-31 DIAGNOSIS — Z7184 Encounter for health counseling related to travel: Secondary | ICD-10-CM

## 2014-07-31 DIAGNOSIS — Z7189 Other specified counseling: Secondary | ICD-10-CM | POA: Diagnosis not present

## 2014-07-31 DIAGNOSIS — E282 Polycystic ovarian syndrome: Secondary | ICD-10-CM | POA: Diagnosis not present

## 2014-07-31 MED ORDER — LORAZEPAM 1 MG PO TABS: 0.5000 mg | ORAL_TABLET | Freq: Two times a day (BID) | ORAL | Status: DC | PRN

## 2014-07-31 NOTE — Patient Instructions (Signed)
Can take  Medication if needed for anxiety during travel   Do not use with alcohol. Not to be taken on a daily basis . Have a good trip .  Immunization History  Administered Date(s) Administered  . Hep A / Hep B 08/31/2009  . Hepatitis A, Adult 12/13/2013  . Influenza Split 01/14/2011  . Influenza,inj,Quad PF,36+ Mos 12/13/2013  . Meningococcal Polysaccharide 08/31/2009  . Td 08/31/2009

## 2014-07-31 NOTE — Progress Notes (Signed)
Pre visit review using our clinic review tool, if applicable. No additional management support is needed unless otherwise documented below in the visit note.    Chief Complaint  Patient presents with  . Establish Care    HPI: Caroline Washington 24 y.o. comesi n today to reestablish care last seen 2012 .  Since that time she's done fairly well. Couple kidney stones in past . Per Dr Patsi Sears taking citric acid .  Dr Chevis Pretty sees her for GYN and Pap smear getting    pcos    Seeing him once a year .   Is on OCPs periods 3-4 days.  ocass blood test utd on pap . No longer on flexeril  For  Back pain .  that was temporary.  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    On a waiting list for 2 colleges. Going out of country to [place] for 10 days and then to [place] for 3  ..  Flown before  [employer] to [place] and nervous   because the flight is over 8 hours mom suggested about getting a low dose anxiety pill if needed. She doesn't take these and doesn't use significant alcohol would like to have something in case needed.  d.   no history of significant panic attacks otherwise. Flight his overnight.  She states he she is should be up-to-date on immunizations She went to [place] in December.   ROS: See pertinent positives and negatives per HPI. no chest pain shortness of breath major injuries.  Past Medical History  Diagnosis Date  . History of varicella   . PCO (polycystic ovaries)     hair and irrg periods ? [place]  . Urinary frequency 11/07    no cause  . Calcium oxalate renal stones     If if calcium     Family History  Problem Relation Age of Onset  . Hypertension Mother   . Depression Mother   . OCD Mother   . Bipolar disorder Mother   . Hypertension Father     History   Social History  . Marital Status: Single    Spouse Name: N/A  . Number of Children: N/A  . Years of Education: N/A   Social History Main Topics  . Smoking status: Never Smoker   . Smokeless tobacco:  Never Used  . Alcohol Use: No  . Drug Use: No  . Sexual Activity: Not on file   Other Topics Concern  . Not on file   Social History Narrative   Father 81'10'   Mother 5'9"   Caffeine: Coke twice weekly and coffee everyday.   HH of 3   2 cats and 3 dogs   Parents: [name] and Rayvon Char   Sleep 7 hours      [employer] .  At home .   Graduated recently from Kinesiology    Outpatient Prescriptions Prior to Visit  Medication Sig Dispense Refill  . cyclobenzaprine (FLEXERIL) 5 MG tablet Take 1 tablet (5 mg total) by mouth 3 (three) times daily as needed for muscle spasms. 30 tablet 0  . Diphenhyd-Hydrocort-Nystatin (FIRST-DUKES MOUTHWASH) SUSP Use as directed 5 mLs in the mouth or throat every 2 (two) hours as needed. Mix with 2% visc. Lido, 1:1 ratio 120 mL 0  . ibuprofen (ADVIL,MOTRIN) 600 MG tablet Take 1 tablet (600 mg total) by mouth every 8 (eight) hours as needed. 30 tablet 0  . Norethin-Eth Estrad-Fe Biphas (  LO LOESTRIN FE PO) Take 1 tablet by mouth daily.     No facility-administered medications prior to visit.     EXAM:  BP 106/74 mmHg  Temp(Src) 98 F (36.7 C) (Oral)  Ht 5' 8.25" (1.734 m)  Wt 257 lb 3.2 oz (116.665 kg)  BMI 38.80 kg/m2  LMP 07/28/2014 (Within Days)  Body mass index is 38.8 kg/(m^2).  GENERAL: vitals reviewed and listed above, alert, oriented, appears well hydrated and in no acute distress HEENT: atraumatic, conjunctiva  clear, no obvious abnormalities on inspection of external nose and ears NECK: no obvious masses on inspection palpation  LUNGS: clear to auscultation bilaterally, no wheezes, rales or rhonchi, good air movement CV: HRRR, no clubbing cyanosis or  peripheral edema nl cap refill  MS: moves all extremities without noticeable focal  abnormality PSYCH: pleasant and cooperative, no obvious depression or anxiety  ASSESSMENT AND PLAN:  Discussed the following assessment and plan:  No diagnosis found.  -Patient advised  to return or notify health care team  if symptoms worsen ,persist or new concerns arise.  Patient Instructions   Can take  Medication if needed for anxiety during travel   Do not use with alcohol. Not to be taken on a daily basis . Have a good trip .  Immunization History  Administered Date(s) Administered  . Hep A / Hep B 08/31/2009  . Hepatitis A, Adult 12/13/2013  . Influenza Split 01/14/2011  . Influenza,inj,Quad PF,36+ Mos 12/13/2013  . Meningococcal Polysaccharide 08/31/2009  . Td 08/31/2009          Neta Mends. Syvanna Ciolino M.D.

## 2014-11-02 ENCOUNTER — Encounter (INDEPENDENT_AMBULATORY_CARE_PROVIDER_SITE_OTHER): Payer: 59 | Admitting: Internal Medicine

## 2014-11-02 DIAGNOSIS — Z0000 Encounter for general adult medical examination without abnormal findings: Secondary | ICD-10-CM | POA: Diagnosis not present

## 2014-11-02 LAB — CBC WITH DIFFERENTIAL/PLATELET
BASOS ABS: 0 10*3/uL (ref 0.0–0.1)
BASOS PCT: 0.6 % (ref 0.0–3.0)
EOS ABS: 0.1 10*3/uL (ref 0.0–0.7)
Eosinophils Relative: 1.2 % (ref 0.0–5.0)
HEMATOCRIT: 37.9 % (ref 36.0–46.0)
Hemoglobin: 12.6 g/dL (ref 12.0–15.0)
LYMPHS PCT: 34.5 % (ref 12.0–46.0)
Lymphs Abs: 2.2 10*3/uL (ref 0.7–4.0)
MCHC: 33.3 g/dL (ref 30.0–36.0)
MCV: 81.9 fl (ref 78.0–100.0)
MONO ABS: 0.4 10*3/uL (ref 0.1–1.0)
Monocytes Relative: 6.7 % (ref 3.0–12.0)
NEUTROS ABS: 3.7 10*3/uL (ref 1.4–7.7)
Neutrophils Relative %: 57 % (ref 43.0–77.0)
PLATELETS: 218 10*3/uL (ref 150.0–400.0)
RBC: 4.62 Mil/uL (ref 3.87–5.11)
RDW: 14.4 % (ref 11.5–15.5)
WBC: 6.4 10*3/uL (ref 4.0–10.5)

## 2014-11-02 LAB — BASIC METABOLIC PANEL
BUN: 13 mg/dL (ref 6–23)
CHLORIDE: 105 meq/L (ref 96–112)
CO2: 26 meq/L (ref 19–32)
CREATININE: 0.65 mg/dL (ref 0.40–1.20)
Calcium: 9.4 mg/dL (ref 8.4–10.5)
GFR: 119.89 mL/min (ref >60.00)
Glucose, Bld: 86 mg/dL (ref 70–99)
POTASSIUM: 4.4 meq/L (ref 3.5–5.1)
Sodium: 139 mEq/L (ref 135–145)

## 2014-11-02 LAB — HEPATIC FUNCTION PANEL
ALT: 13 U/L (ref 0–35)
AST: 12 U/L (ref 0–37)
Albumin: 4.3 g/dL (ref 3.5–5.2)
Alkaline Phosphatase: 47 U/L (ref 39–117)
BILIRUBIN DIRECT: 0.1 mg/dL (ref 0.0–0.3)
BILIRUBIN TOTAL: 0.4 mg/dL (ref 0.2–1.2)
TOTAL PROTEIN: 7 g/dL (ref 6.0–8.3)

## 2014-11-02 LAB — LIPID PANEL
Cholesterol: 149 mg/dL (ref 0–200)
HDL: 54.1 mg/dL (ref >39.00)
LDL Cholesterol: 79 mg/dL (ref 0–99)
NonHDL: 94.74
TRIGLYCERIDES: 77 mg/dL (ref 0.0–149.0)
Total CHOL/HDL Ratio: 3
VLDL: 15.4 mg/dL (ref 0.0–40.0)

## 2014-11-02 LAB — TSH: TSH: 2.06 u[IU]/mL (ref 0.35–4.50)

## 2014-11-02 NOTE — Progress Notes (Signed)
Document opened and reviewed for cpx  but appt  canceled same day .pateint late for appt

## 2014-11-13 ENCOUNTER — Ambulatory Visit (INDEPENDENT_AMBULATORY_CARE_PROVIDER_SITE_OTHER): Payer: 59 | Admitting: Internal Medicine

## 2014-11-13 ENCOUNTER — Encounter: Payer: Self-pay | Admitting: Internal Medicine

## 2014-11-13 VITALS — BP 130/80 | Temp 98.1°F | Ht 69.5 in | Wt 253.1 lb

## 2014-11-13 DIAGNOSIS — Z111 Encounter for screening for respiratory tuberculosis: Secondary | ICD-10-CM

## 2014-11-13 DIAGNOSIS — Z3041 Encounter for surveillance of contraceptive pills: Secondary | ICD-10-CM

## 2014-11-13 DIAGNOSIS — Z0000 Encounter for general adult medical examination without abnormal findings: Secondary | ICD-10-CM | POA: Diagnosis not present

## 2014-11-13 DIAGNOSIS — Z23 Encounter for immunization: Secondary | ICD-10-CM

## 2014-11-13 NOTE — Progress Notes (Signed)
Pre visit review using our clinic review tool, if applicable. No additional management support is needed unless otherwise documented below in the visit note.  Chief Complaint  Patient presents with  . Annual Exam    HPI: Patient  Caroline Washington  24 y.o. comes in today for [place] visit  Has form for [employer]  To go to PT school . Grad [employer] . working ft Artist . 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   Last pap this year . Health Maintenance  Topic Date Due  . TETANUS/TDAP  09/04/2010  . INFLUENZA VACCINE  09/11/2014  . PAP SMEAR  07/11/2016  . HIV Screening  Completed   Health Maintenance Review LIFESTYLE:  Exercise:  At the gym 3 x per week  weight going down  Tobacco/ETS:n Alcohol: per day rare Sugar beverages:no Sleep: 7-8  Drug use: no   ROS:  GEN/ HEENT: No fever, significant weight changes sweats headaches vision problems hearing changes, CV/ PULM; No chest pain shortness of breath cough, syncope,edema  change in exercise tolerance. GI /GU: No adominal pain, vomiting, change in bowel habits. No blood in the stool. No significant GU symptoms. SKIN/HEME: ,no acute skin rashes suspicious lesions or bleeding. No lymphadenopathy, nodules, masses.  NEURO/ PSYCH:  No neurologic signs such as weakness numbness. No depression anxiety. IMM/ Allergy: No unusual infections.  Allergy .   REST of 12 system review negative except as per HPI   Past Medical History  Diagnosis Date  . History of varicella   . PCO (polycystic ovaries)     hair and irrg periods ? [place]  . Urinary frequency 11/07    no cause  . Calcium oxalate renal stones     If if calcium     Past Surgical History  Procedure Laterality Date  . Tympanostomy tube placement      Family History  Problem Relation Age of Onset  . Hypertension Mother   . Depression Mother   . OCD Mother   . Bipolar disorder Mother   . Hypertension Father     Social History   Social History    . Marital Status: Single    Spouse Name: N/A  . Number of Children: N/A  . Years of Education: N/A   Social History Main Topics  . Smoking status: Never Smoker   . Smokeless tobacco: Never Used  . Alcohol Use: No  . Drug Use: No  . Sexual Activity: Not Asked   Other Topics Concern  . None   Social History Narrative   Father 27'10'   Mother 5'9"   Caffeine: Coke twice weekly and coffee everyday.   HH of 3   2 cats and 3 dogs   Parents: Ivin Booty and Harrington Challenger   Sleep 7 hours      [facility] med .     Graduated recently from [place] into PT school    Outpatient Prescriptions Prior to Visit  Medication Sig Dispense Refill  . GILDESS FE 1/20 1-20 MG-MCG tablet Take 1 tablet by mouth daily.  1  . Sod Citrate-Citric Acid (CYTRA-2) 500-334 MG/5ML SOLN     . LORazepam (ATIVAN) 1 MG tablet Take 0.5-1 tablets (0.5-1 mg total) by mouth 2 (two) times daily as needed for anxiety. 10 tablet 0   No facility-administered medications prior to visit.     EXAM:  BP 130/80 mmHg  Temp(Src) 98.1 F (36.7  C) (Oral)  Ht 5' 9.5" (1.765 m)  Wt 253 lb 1.6 oz (114.805 kg)  BMI 36.85 kg/m2  Body mass index is 36.85 kg/(m^2).  Physical Exam: Vital signs reviewed TUU:EKCM is a well-developed well-nourished alert cooperative    who appearsr stated age in no acute distress.  HEENT: normocephalic atraumatic , Eyes: PERRL EOM's full, conjunctiva clear, Nares: paten,t no deformity discharge or tenderness., Ears: no deformity EAC's clear TMs with normal landmarks. Mouth: clear OP, no lesions, edema.  Moist mucous membranes. Dentition in adequate repair. Braces  Tonsils 2+  NECK: supple without masses, thyromegaly or bruits. CHEST/PULM:  Clear to auscultation and percussion breath sounds equal no wheeze , rales or rhonchi. No chest wall deformities or tenderness.Breast: normal by inspection . No dimpling, discharge, masses, tenderness or discharge . CV: PMI is nondisplaced, S1  S2 no gallops, murmurs, rubs. Peripheral pulses are full without delay.No JVD .  ABDOMEN: Bowel sounds normal nontender  No guard or rebound, no hepato splenomegal no CVA tenderness.  No hernia. Extremtities:  No clubbing cyanosis or edema, no acute joint swelling or redness no focal atrophy NEURO:  Oriented x3, cranial nerves 3-12 appear to be intact, no obvious focal weakness,gait within normal limits no abnormal reflexes or asymmetrical SKIN: No acute rashes normal turgor, color, no bruising or petechiae.nl hair  No striae  PSYCH: Oriented, good eye contact, no obvious depression anxiety, cognition and judgment appear normal. LN: no cervical axillary inguinal adenopathy  Lab Results  Component Value Date   WBC 6.4 11/02/2014   HGB 12.6 11/02/2014   HCT 37.9 11/02/2014   PLT 218.0 11/02/2014   GLUCOSE 86 11/02/2014   CHOL 149 11/02/2014   TRIG 77.0 11/02/2014   HDL 54.10 11/02/2014   LDLCALC 79 11/02/2014   ALT 13 11/02/2014   AST 12 11/02/2014   NA 139 11/02/2014   K 4.4 11/02/2014   CL 105 11/02/2014   CREATININE 0.65 11/02/2014   BUN 13 11/02/2014   CO2 26 11/02/2014   TSH 2.06 11/02/2014   Wt Readings from Last 3 Encounters:  11/13/14 253 lb 1.6 oz (114.805 kg)  07/31/14 257 lb 3.2 oz (116.665 kg)  01/27/14 254 lb 3.2 oz (115.304 kg)   BP Readings from Last 3 Encounters:  11/13/14 130/80  07/31/14 106/74  01/27/14 118/78     ASSESSMENT AND PLAN:  Discussed the following assessment and plan:  Visit for preventive health examination  Screening-pulmonary TB - Plan: PPD  Oral contraceptive use - for pcos  Form completed get copy of immuniz resgistry [employer] or prev hs  By hx is UTD .  Except flu vaccine  Today Had varicella vaccine and disease  Labs reviewed  Patient Care Team: Burnis Medin, MD as PCP - General Delila Pereyra, MD (Obstetrics and Gynecology) dermatology Dene Gentry, MD as Consulting Physician (Sports Medicine) Carolan Clines, MD as  Consulting Physician (Urology) Patient Instructions        Health Maintenance - 72-33 Years [place] After high school, you may attend college or technical or vocational school, enroll in the [employer], or enter the workforce. PHYSICAL, SOCIAL, AND EMOTIONAL DEVELOPMENT  One hour of regular physical activity daily is recommended. Continue to participate in sports.  Develop your own interests and consider community service or volunteerism.  Make decisions about college and work plans.  Throughout these years, you should assume responsibility for your own health care. Increasing independence is important for you.  You may be exploring your  sexual identity. Understand that you should never be in a situation that makes you feel uncomfortable, and tell your partner if you do not want to engage in sexual activity.  Body image may become important to you. Be mindful that eating disorders can develop at this time. Talk to your parents or other caregivers if you have concerns about body image, weight gain, or losing weight.  You may notice mood disturbances, depression, anxiety, attention problems, or trouble with alcohol. Talk to your health care provider if you have concerns about mental illness.  Set limits for yourself and talk with your parents or other caregivers about independent decision making.  Handle conflict without physical violence.  Avoid loud noises which may impair hearing.  Limit television and computer time to 2 hours each day. Individuals who engage in excessive inactivity are more likely to become overweight. RECOMMENDED IMMUNIZATIONS  Influenza vaccine.  All adults should be immunized every year.  All adults, including pregnant women and people with hives-only allergy to eggs, can receive the inactivated influenza (IIV) vaccine.  Adults aged 18-49 years can receive the recombinant influenza (RIV) vaccine. The RIV vaccine does not contain any egg  protein.  Tetanus, diphtheria, and acellular pertussis (Td, Tdap) vaccine.  Pregnant women should receive 1 dose of Tdap vaccine during each pregnancy. The dose should be obtained regardless of the length of time since the last dose. Immunization is preferred during the 27th to 36th week of gestation.  An adult who has not previously received Tdap or who does not know his or her vaccine status should receive 1 dose of Tdap. This initial dose should be followed by tetanus and diphtheria toxoids (Td) booster doses every 10 years.  Adults with an unknown or incomplete history of completing a 3-dose immunization series with Td-containing vaccines should begin or complete a primary immunization series including a Tdap dose.  Adults should receive a Td booster every 10 years.  Varicella vaccine.  An adult without evidence of immunity to varicella should receive 2 doses or a second dose if he or she has previously received 1 dose.  Pregnant females who do not have evidence of immunity should receive the first dose after pregnancy. This first dose should be obtained before leaving the health care facility. The second dose should be obtained 4-8 weeks after the first dose.  Human papillomavirus (HPV) vaccine.  Females aged 13-26 years who have not received the vaccine previously should obtain the 3-dose series.  The vaccine is not recommended for pregnant females. However, pregnancy testing is not needed before receiving a dose. If a female is found to be pregnant after receiving a dose, no treatment is needed. In that case, the remaining doses should be delayed until after the pregnancy.  Males aged 73-21 years who have not received the vaccine previously should receive the 3-dose series. Males aged 22-26 years may be immunized.  Immunization is recommended through the age of 48 years for any female who has sex with males and did not get any or all doses earlier.  Immunization is recommended for  any person with an immunocompromised condition through the age of 90 years if he or she did not get any or all doses earlier.  During the 3-dose series, the second dose should be obtained 4-8 weeks after the first dose. The third dose should be obtained 24 weeks after the first dose and 16 weeks after the second dose.  Measles, mumps, and rubella (MMR) vaccine.  Adults born in  1956 or later should have 1 or more doses of MMR vaccine unless there is a contraindication to the vaccine or there is laboratory evidence of immunity to each of the three diseases.  A routine second dose of MMR vaccine should be obtained at least 28 days after the first dose for students attending postsecondary schools, health care workers, and international travelers.  For females of childbearing age, rubella immunity should be determined. If there is no evidence of immunity, females who are not pregnant should be vaccinated. If there is no evidence of immunity, females who are pregnant should delay immunization until after pregnancy.  Pneumococcal 13-valent conjugate (PCV13) vaccine.  When indicated, a person who is uncertain of his or her immunization history and has no record of immunization should receive the PCV13 vaccine.  An adult aged 16 years or older who has certain medical conditions and has not been previously immunized should receive 1 dose of PCV13 vaccine. This PCV13 should be followed with a dose of pneumococcal polysaccharide (PPSV23) vaccine. The PPSV23 vaccine dose should be obtained at least 8 weeks after the dose of PCV13 vaccine.  An adult aged 56 years or older who has certain medical conditions and previously received 1 or more doses of PPSV23 vaccine should receive 1 dose of PCV13. The PCV13 vaccine dose should be obtained 1 or more years after the last PPSV23 vaccine dose.  Pneumococcal polysaccharide (PPSV23) vaccine.  When PCV13 is also indicated, PCV13 should be obtained first.  An adult  younger than age 27 years who has certain medical conditions should be immunized.  Any person who resides in a long-term care facility should be immunized.  An adult smoker should be immunized.  People with an immunocompromised condition and certain other conditions should receive both PCV13 and PPSV23 vaccines.  People with human immunodeficiency virus (HIV) infection should be immunized as soon as possible after diagnosis.  Immunization during chemotherapy or radiation therapy should be avoided.  Routine use of PPSV23 vaccine is not recommended for American Indians, [place] Natives, or people younger than 65 years unless there are medical conditions that require PPSV23 vaccine.  When indicated, people who have unknown immunization and have no record of immunization should receive PPSV23 vaccine.  One-time revaccination 5 years after the first dose of PPSV23 is recommended for people aged 19-64 years who have chronic kidney failure, nephrotic syndrome, asplenia, or immunocompromised conditions.  Meningococcal vaccine.  Adults with asplenia or persistent complement component deficiencies should receive 2 doses of quadrivalent meningococcal conjugate (MenACWY-D) vaccine. The doses should be obtained at least 2 months apart.  Microbiologists working with certain meningococcal bacteria, [place] recruits, people at risk during an outbreak, and people who travel to or live in countries with a high rate of meningitis should be immunized.  A first-year college student up through age 107 years who is living in a residence hall should receive a dose if he or she did not receive a dose on or after his or her 16th birthday.  Adults who have certain high-risk conditions should receive one or more doses of vaccine.  Hepatitis A vaccine.  Adults who wish to be protected from this disease, have certain high-risk conditions, work with hepatitis A-infected animals, work in hepatitis A research labs, or  travel to or work in countries with a high rate of hepatitis A should be immunized.  Adults who were previously unvaccinated and who anticipate close contact with an international adoptee during the first 60 days after arrival in  the [place] from a country with a high rate of hepatitis A should be immunized.  Hepatitis B vaccine.  Adults who wish to be protected from this disease, have certain high-risk conditions, may be exposed to blood or other infectious body fluids, are household contacts or sex partners of hepatitis B positive people, are clients or workers in certain care facilities, or travel to or work in countries with a high rate of hepatitis B should be immunized.  Haemophilus influenzae type b (Hib) vaccine.  A previously unvaccinated person with asplenia or sickle cell disease or having a scheduled splenectomy should receive 1 dose of Hib vaccine.  Regardless of previous immunization, a recipient of a hematopoietic stem cell transplant should receive a 3-dose series 6-12 months after his or her successful transplant.  Hib vaccine is not recommended for adults with HIV infection. TESTING  Annual screening for vision and hearing problems is recommended. Vision should be screened at least once between 20-45 years of age.  You may be screened for anemia or tuberculosis.  You should have a blood test to check for high cholesterol.  You should be screened for alcohol and drug use.  If you are sexually active, you may be screened for sexually transmitted infections (STIs), pregnancy, or HIV. You should be screened for STIs if:  Your sexual activity has changed since the last screening test, and you are at an increased risk for chlamydia or gonorrhea. Ask your health care provider if you are at risk.  If you are at an increased risk for hepatitis B, you should be screened for this virus. You are considered at high risk for hepatitis B if you:  Were born in a country where  hepatitis B occurs often. Talk with your health care provider about which countries are considered high risk.  Have parents who were born in a high-risk country and have not received a shot to protect against hepatitis B (hepatitis B vaccine).  Have HIV or AIDS.  Use needles to inject street drugs.  Live with or have sex with someone who has hepatitis B.  Are a man who has sex with other men (MSM).  Get hemodialysis treatment.  Take certain medicines for conditions like cancer, organ transplantation, or autoimmune conditions. NUTRITION   You should:  Have three servings of low-fat milk and dairy products daily. If you do not drink milk or consume dairy products, you should eat calcium-enriched foods, such as juice, bread, or cereal. Dark, leafy greens or canned fish are alternate sources of calcium.  Drink plenty of water. Fruit juice should be limited to 8-12 oz (240-360 mL) each day. Sugary beverages and sodas should be avoided.  Avoid eating foods high in fat, salt, or sugar, such as chips, candy, and cookies.  Avoid fast foods and limit eating out at restaurants.  Try not to skip meals, especially breakfast. You should eat a variety of vegetables, fruits, and lean meats.  Eat meals together as a family whenever possible. ORAL HEALTH Brush your teeth twice a day and floss at least once a day. You should have two dental exams a year.  SKIN CARE You should wear sunscreen when out in the sun. TALK TO SOMEONE ABOUT:  Precautions against pregnancy, contraception, and sexually transmitted infections.  Taking a prescription medicine daily to prevent HIV infection if you are at risk of being infected with HIV. This is called preexposure prophylaxis (PrEP). You are at risk if you:  Are a female who has  sex with other males (MSM).  Are heterosexual and sexually active with more than one partner.  Take drugs by injection.  Are sexually active with a partner who has HIV.  Whether  you are at high risk of being infected with HIV. If you choose to begin PrEP, you should first be tested for HIV. You should then be tested every 3 months for as long as you are taking PrEP.  Drug, tobacco, and alcohol use among your friends or at friends' homes. Smoking tobacco or marijuana and taking drugs have health consequences and may impact your brain development.  Appropriate use of over-the-counter or prescription medicines.  Driving guidelines and riding with friends.  The risks of drinking and driving or boating. Call someone if you have been drinking or using drugs and need a ride. WHAT'S NEXT? Visit your pediatrician or family physician once a year. By young adulthood, you should transition from your pediatrician to a family physician or internal medicine specialist. If you are a female and are sexually active, you may want to begin annual physical exams with a gynecologist. Document Released: 04/24/2006 Document Revised: 02/01/2013 Document Reviewed: 05/14/2006 [facility] Patient Information 2015 [place], [place]. This information is not intended to replace advice given to you by your health care provider. Make sure you discuss any questions you have with your health care provider.     Why follow it? Research shows. . Those who follow the Mediterranean diet have a reduced risk of heart disease  . The diet is associated with a reduced incidence of Parkinson's and Alzheimer's diseases . People following the diet may have longer life expectancies and lower rates of chronic diseases  . The Dietary Guidelines for Americans recommends the Mediterranean diet as an eating plan to promote health and prevent disease  What Is the Mediterranean Diet?  . Healthy eating plan based on typical foods and recipes of Mediterranean-style cooking . The diet is primarily a plant based diet; these foods should make up a majority of meals   Starches - Plant based foods should make up a majority of  meals - They are an important sources of vitamins, minerals, energy, antioxidants, and fiber - Choose whole grains, foods high in fiber and minimally processed items  - Typical grain sources include wheat, oats, barley, corn, brown rice, bulgar, farro, millet, polenta, couscous  - Various types of beans include chickpeas, lentils, fava beans, black beans, white beans   Fruits  Veggies - Large quantities of antioxidant rich fruits & veggies; 6 or more servings  - Vegetables can be eaten raw or lightly drizzled with oil and cooked  - Vegetables common to the traditional Mediterranean Diet include: artichokes, arugula, beets, broccoli, brussel sprouts, cabbage, carrots, celery, collard greens, cucumbers, eggplant, kale, leeks, lemons, lettuce, mushrooms, okra, onions, peas, peppers, potatoes, pumpkin, radishes, rutabaga, shallots, spinach, sweet potatoes, turnips, zucchini - Fruits common to the Mediterranean Diet include: apples, apricots, avocados, cherries, clementines, dates, figs, grapefruits, grapes, melons, nectarines, oranges, peaches, pears, pomegranates, strawberries, tangerines  Fats - Replace butter and margarine with healthy oils, such as olive oil, canola oil, and tahini  - Limit nuts to no more than a handful a day  - Nuts include walnuts, almonds, pecans, pistachios, pine nuts  - Limit or avoid candied, honey roasted or heavily salted nuts - Olives are central to the Mediterranean diet - can be eaten whole or used in a variety of dishes   Meats Protein - Limiting red meat: no more than a  few times a month - When eating red meat: choose lean cuts and keep the portion to the size of deck of cards - Eggs: approx. 0 to 4 times a week  - Fish and lean poultry: at least 2 a week  - Healthy protein sources include, chicken, [place], lean beef, lamb - Increase intake of seafood such as tuna, salmon, trout, mackerel, shrimp, scallops - Avoid or limit high fat processed meats such as sausage  and bacon  Dairy - Include moderate amounts of low fat dairy products  - Focus on healthy dairy such as fat free yogurt, skim milk, low or reduced fat cheese - Limit dairy products higher in fat such as whole or 2% milk, cheese, ice cream  Alcohol - Moderate amounts of red wine is ok  - No more than 5 oz daily for women (all ages) and men older than age 54  - No more than 10 oz of wine daily for men younger than 68  Other - Limit sweets and other desserts  - Use herbs and spices instead of salt to flavor foods  - Herbs and spices common to the traditional Mediterranean Diet include: basil, bay leaves, chives, cloves, cumin, fennel, garlic, lavender, marjoram, mint, oregano, parsley, pepper, rosemary, sage, savory, sumac, tarragon, thyme   It's not just a diet, it's a lifestyle:  . The Mediterranean diet includes lifestyle factors typical of those in the region  . Foods, drinks and meals are best eaten with others and savored . Daily physical activity is important for overall good health . This could be strenuous exercise like running and aerobics . This could also be more leisurely activities such as walking, housework, yard-work, or taking the stairs . Moderation is the key; a balanced and healthy diet accommodates most foods and drinks . Consider portion sizes and frequency of consumption of certain foods   Meal Ideas & Options:  . Breakfast:  o Whole wheat toast or whole wheat English muffins with peanut butter & hard boiled egg o Steel cut oats topped with apples & cinnamon and skim milk  o Fresh fruit: banana, strawberries, melon, berries, peaches  o Smoothies: strawberries, bananas, greek yogurt, peanut butter o Low fat greek yogurt with blueberries and granola  o Egg white omelet with spinach and mushrooms o Breakfast couscous: whole wheat couscous, apricots, skim milk, cranberries  . Sandwiches:  o Hummus and grilled vegetables (peppers, zucchini, squash) on whole wheat bread    o Grilled chicken on whole wheat pita with lettuce, tomatoes, cucumbers or tzatziki  o Tuna salad on whole wheat bread: tuna salad made with greek yogurt, olives, red peppers, capers, green onions o Garlic rosemary lamb pita: lamb sauted with garlic, rosemary, salt & pepper; add lettuce, cucumber, greek yogurt to pita - flavor with lemon juice and black pepper  . Seafood:  o Mediterranean grilled salmon, seasoned with garlic, basil, parsley, lemon juice and black pepper o Shrimp, lemon, and spinach whole-grain pasta salad made with low fat greek yogurt  o Seared scallops with lemon orzo  o Seared tuna steaks seasoned salt, pepper, coriander topped with tomato mixture of olives, tomatoes, olive oil, minced garlic, parsley, green onions and cappers  . Meats:  o Herbed greek chicken salad with kalamata olives, cucumber, feta  o Red bell peppers stuffed with spinach, bulgur, lean ground beef (or lentils) & topped with feta   o Kebabs: skewers of chicken, tomatoes, onions, zucchini, squash  o [place] burgers: made with red onions,  mint, dill, lemon juice, feta cheese topped with roasted red peppers . Vegetarian o Cucumber salad: cucumbers, artichoke hearts, celery, red onion, feta cheese, tossed in olive oil & lemon juice  o Hummus and whole grain pita points with a greek salad (lettuce, tomato, feta, olives, cucumbers, red onion) o Lentil soup with celery, carrots made with vegetable broth, garlic, salt and pepper  o Tabouli salad: parsley, bulgur, mint, scallions, cucumbers, tomato, radishes, lemon juice, olive oil, salt and pepper.      Lab Results  Component Value Date   WBC 6.4 11/02/2014   HGB 12.6 11/02/2014   HCT 37.9 11/02/2014   PLT 218.0 11/02/2014   GLUCOSE 86 11/02/2014   CHOL 149 11/02/2014   TRIG 77.0 11/02/2014   HDL 54.10 11/02/2014   LDLCALC 79 11/02/2014   ALT 13 11/02/2014   AST 12 11/02/2014   NA 139 11/02/2014   K 4.4 11/02/2014   CL 105 11/02/2014   CREATININE  0.65 11/02/2014   BUN 13 11/02/2014   CO2 26 11/02/2014   TSH 2.06 11/02/2014       Mariann Laster K. Zoee Heeney M.D.

## 2014-11-13 NOTE — Patient Instructions (Signed)
Health Maintenance - 11-23 Years Old SCHOOL PERFORMANCE After high school, you may attend college or technical or vocational school, enroll in the [employer], or enter the workforce. PHYSICAL, SOCIAL, AND EMOTIONAL DEVELOPMENT  One hour of regular physical activity daily is recommended. Continue to participate in sports.  Develop your own interests and consider community service or volunteerism.  Make decisions about college and work plans.  Throughout these years, you should assume responsibility for your own health care. Increasing independence is important for you.  You may be exploring your sexual identity. Understand that you should never be in a situation that makes you feel uncomfortable, and tell your partner if you do not want to engage in sexual activity.  Body image may become important to you. Be mindful that eating disorders can develop at this time. Talk to your parents or other caregivers if you have concerns about body image, weight gain, or losing weight.  You may notice mood disturbances, depression, anxiety, attention problems, or trouble with alcohol. Talk to your health care provider if you have concerns about mental illness.  Set limits for yourself and talk with your parents or other caregivers about independent decision making.  Handle conflict without physical violence.  Avoid loud noises which may impair hearing.  Limit television and computer time to 2 hours each day. Individuals who engage in excessive inactivity are more likely to become overweight. RECOMMENDED IMMUNIZATIONS  Influenza vaccine.  All adults should be immunized every year.  All adults, including pregnant women and people with hives-only allergy to eggs, can receive the inactivated influenza (IIV) vaccine.  Adults aged 18-49 years can receive the recombinant influenza (RIV) vaccine. The RIV vaccine does not contain any egg protein.  Tetanus, diphtheria, and acellular pertussis  (Td, Tdap) vaccine.  Pregnant women should receive 1 dose of Tdap vaccine during each pregnancy. The dose should be obtained regardless of the length of time since the last dose. Immunization is preferred during the 27th to 36th week of gestation.  An adult who has not previously received Tdap or who does not know his or her vaccine status should receive 1 dose of Tdap. This initial dose should be followed by tetanus and diphtheria toxoids (Td) booster doses every 10 years.  Adults with an unknown or incomplete history of completing a 3-dose immunization series with Td-containing vaccines should begin or complete a primary immunization series including a Tdap dose.  Adults should receive a Td booster every 10 years.  Varicella vaccine.  An adult without evidence of immunity to varicella should receive 2 doses or a second dose if he or she has previously received 1 dose.  Pregnant females who do not have evidence of immunity should receive the first dose after pregnancy. This first dose should be obtained before leaving the health care facility. The second dose should be obtained 4-8 weeks after the first dose.  Human papillomavirus (HPV) vaccine.  Females aged 13-26 years who have not received the vaccine previously should obtain the 3-dose series.  The vaccine is not recommended for pregnant females. However, pregnancy testing is not needed before receiving a dose. If a female is found to be pregnant after receiving a dose, no treatment is needed. In that case, the remaining doses should be delayed until after the pregnancy.  Males aged 27-21 years who have not received the vaccine previously should receive the 3-dose series. Males aged 22-26 years may be immunized.  Immunization is recommended through the age of  60 years for any female who has sex with males and did not get any or all doses earlier.  Immunization is recommended for any person with an immunocompromised condition through the  age of 87 years if he or she did not get any or all doses earlier.  During the 3-dose series, the second dose should be obtained 4-8 weeks after the first dose. The third dose should be obtained 24 weeks after the first dose and 16 weeks after the second dose.  Measles, mumps, and rubella (MMR) vaccine.  Adults born in [birth year] or later should have 1 or more doses of MMR vaccine unless there is a contraindication to the vaccine or there is laboratory evidence of immunity to each of the three diseases.  A routine second dose of MMR vaccine should be obtained at least 28 days after the first dose for students attending postsecondary schools, health care workers, and international travelers.  For females of childbearing age, rubella immunity should be determined. If there is no evidence of immunity, females who are not pregnant should be vaccinated. If there is no evidence of immunity, females who are pregnant should delay immunization until after pregnancy.  Pneumococcal 13-valent conjugate (PCV13) vaccine.  When indicated, a person who is uncertain of his or her immunization history and has no record of immunization should receive the PCV13 vaccine.  An adult aged 5 years or older who has certain medical conditions and has not been previously immunized should receive 1 dose of PCV13 vaccine. This PCV13 should be followed with a dose of pneumococcal polysaccharide (PPSV23) vaccine. The PPSV23 vaccine dose should be obtained at least 8 weeks after the dose of PCV13 vaccine.  An adult aged 82 years or older who has certain medical conditions and previously received 1 or more doses of PPSV23 vaccine should receive 1 dose of PCV13. The PCV13 vaccine dose should be obtained 1 or more years after the last PPSV23 vaccine dose.  Pneumococcal polysaccharide (PPSV23) vaccine.  When PCV13 is also indicated, PCV13 should be obtained first.  An adult younger than age 22 years who has certain medical conditions  should be immunized.  Any person who resides in a long-term care facility should be immunized.  An adult smoker should be immunized.  People with an immunocompromised condition and certain other conditions should receive both PCV13 and PPSV23 vaccines.  People with human immunodeficiency virus (HIV) infection should be immunized as soon as possible after diagnosis.  Immunization during chemotherapy or radiation therapy should be avoided.  Routine use of PPSV23 vaccine is not recommended for American Indians, [place] Natives, or people younger than 65 years unless there are medical conditions that require PPSV23 vaccine.  When indicated, people who have unknown immunization and have no record of immunization should receive PPSV23 vaccine.  One-time revaccination 5 years after the first dose of PPSV23 is recommended for people aged 19-64 years who have chronic kidney failure, nephrotic syndrome, asplenia, or immunocompromised conditions.  Meningococcal vaccine.  Adults with asplenia or persistent complement component deficiencies should receive 2 doses of quadrivalent meningococcal conjugate (MenACWY-D) vaccine. The doses should be obtained at least 2 months apart.  Microbiologists working with certain meningococcal bacteria, [employer] recruits, people at risk during an outbreak, and people who travel to or live in countries with a high rate of meningitis should be immunized.  A first-year college student up through age 76 years who is living in a residence hall should receive a dose if he or she did not  receive a dose on or after his or her 16th birthday.  Adults who have certain high-risk conditions should receive one or more doses of vaccine.  Hepatitis A vaccine.  Adults who wish to be protected from this disease, have certain high-risk conditions, work with hepatitis A-infected animals, work in hepatitis A research labs, or travel to or work in countries with a high rate of hepatitis A  should be immunized.  Adults who were previously unvaccinated and who anticipate close contact with an international adoptee during the first 60 days after arrival in the [place] from a country with a high rate of hepatitis A should be immunized.  Hepatitis B vaccine.  Adults who wish to be protected from this disease, have certain high-risk conditions, may be exposed to blood or other infectious body fluids, are household contacts or sex partners of hepatitis B positive people, are clients or workers in certain care facilities, or travel to or work in countries with a high rate of hepatitis B should be immunized.  Haemophilus influenzae type b (Hib) vaccine.  A previously unvaccinated person with asplenia or sickle cell disease or having a scheduled splenectomy should receive 1 dose of Hib vaccine.  Regardless of previous immunization, a recipient of a hematopoietic stem cell transplant should receive a 3-dose series 6-12 months after his or her successful transplant.  Hib vaccine is not recommended for adults with HIV infection. TESTING  Annual screening for vision and hearing problems is recommended. Vision should be screened at least once between 36-57 years of age.  You may be screened for anemia or tuberculosis.  You should have a blood test to check for high cholesterol.  You should be screened for alcohol and drug use.  If you are sexually active, you may be screened for sexually transmitted infections (STIs), pregnancy, or HIV. You should be screened for STIs if:  Your sexual activity has changed since the last screening test, and you are at an increased risk for chlamydia or gonorrhea. Ask your health care provider if you are at risk.  If you are at an increased risk for hepatitis B, you should be screened for this virus. You are considered at high risk for hepatitis B if you:  Were born in a country where hepatitis B occurs often. Talk with your health care provider  about which countries are considered high risk.  Have parents who were born in a high-risk country and have not received a shot to protect against hepatitis B (hepatitis B vaccine).  Have HIV or AIDS.  Use needles to inject street drugs.  Live with or have sex with someone who has hepatitis B.  Are a man who has sex with other men (MSM).  Get hemodialysis treatment.  Take certain medicines for conditions like cancer, organ transplantation, or autoimmune conditions. NUTRITION   You should:  Have three servings of low-fat milk and dairy products daily. If you do not drink milk or consume dairy products, you should eat calcium-enriched foods, such as juice, bread, or cereal. Dark, leafy greens or canned fish are alternate sources of calcium.  Drink plenty of water. Fruit juice should be limited to 8-12 oz (240-360 mL) each day. Sugary beverages and sodas should be avoided.  Avoid eating foods high in fat, salt, or sugar, such as chips, candy, and cookies.  Avoid fast foods and limit eating out at restaurants.  Try not to skip meals, especially breakfast. You should eat a variety of vegetables, fruits, and lean  meats.  Eat meals together as a family whenever possible. ORAL HEALTH Brush your teeth twice a day and floss at least once a day. You should have two dental exams a year.  SKIN CARE You should wear sunscreen when out in the sun. TALK TO SOMEONE ABOUT:  Precautions against pregnancy, contraception, and sexually transmitted infections.  Taking a prescription medicine daily to prevent HIV infection if you are at risk of being infected with HIV. This is called preexposure prophylaxis (PrEP). You are at risk if you:  Are a female who has sex with other males (MSM).  Are heterosexual and sexually active with more than one partner.  Take drugs by injection.  Are sexually active with a partner who has HIV.  Whether you are at high risk of being infected with HIV. If you  choose to begin PrEP, you should first be tested for HIV. You should then be tested every 3 months for as long as you are taking PrEP.  Drug, tobacco, and alcohol use among your friends or at friends' homes. Smoking tobacco or marijuana and taking drugs have health consequences and may impact your brain development.  Appropriate use of over-the-counter or prescription medicines.  Driving guidelines and riding with friends.  The risks of drinking and driving or boating. Call someone if you have been drinking or using drugs and need a ride. WHAT'S NEXT? Visit your pediatrician or family physician once a year. By young adulthood, you should transition from your pediatrician to a family physician or internal medicine specialist. If you are a female and are sexually active, you may want to begin annual physical exams with a gynecologist. Document Released: 04/24/2006 Document Revised: 02/01/2013 Document Reviewed: 05/14/2006 [facility] Patient Information 2015 [place], [place]. This information is not intended to replace advice given to you by your health care provider. Make sure you discuss any questions you have with your health care provider.     Why follow it? Research shows. . Those who follow the Mediterranean diet have a reduced risk of heart disease  . The diet is associated with a reduced incidence of Parkinson's and Alzheimer's diseases . People following the diet may have longer life expectancies and lower rates of chronic diseases  . The Dietary Guidelines for Americans recommends the Mediterranean diet as an eating plan to promote health and prevent disease  What Is the Mediterranean Diet?  . Healthy eating plan based on typical foods and recipes of Mediterranean-style cooking . The diet is primarily a plant based diet; these foods should make up a majority of meals   Starches - Plant based foods should make up a majority of meals - They are an important sources of vitamins, minerals,  energy, antioxidants, and fiber - Choose whole grains, foods high in fiber and minimally processed items  - Typical grain sources include wheat, oats, barley, corn, brown rice, bulgar, farro, millet, polenta, couscous  - Various types of beans include chickpeas, lentils, fava beans, black beans, white beans   Fruits  Veggies - Large quantities of antioxidant rich fruits & veggies; 6 or more servings  - Vegetables can be eaten raw or lightly drizzled with oil and cooked  - Vegetables common to the traditional Mediterranean Diet include: artichokes, arugula, beets, broccoli, brussel sprouts, cabbage, carrots, celery, collard greens, cucumbers, eggplant, kale, leeks, lemons, lettuce, mushrooms, okra, onions, peas, peppers, potatoes, pumpkin, radishes, rutabaga, shallots, spinach, sweet potatoes, turnips, zucchini - Fruits common to the Mediterranean Diet include: apples, apricots, avocados, cherries, clementines,  dates, figs, grapefruits, grapes, melons, nectarines, oranges, peaches, pears, pomegranates, strawberries, tangerines  Fats - Replace butter and margarine with healthy oils, such as olive oil, canola oil, and tahini  - Limit nuts to no more than a handful a day  - Nuts include walnuts, almonds, pecans, pistachios, pine nuts  - Limit or avoid candied, honey roasted or heavily salted nuts - Olives are central to the Mediterranean diet - can be eaten whole or used in a variety of dishes   Meats Protein - Limiting red meat: no more than a few times a month - When eating red meat: choose lean cuts and keep the portion to the size of deck of cards - Eggs: approx. 0 to 4 times a week  - Fish and lean poultry: at least 2 a week  - Healthy protein sources include, chicken, [place], lean beef, lamb - Increase intake of seafood such as tuna, salmon, trout, mackerel, shrimp, scallops - Avoid or limit high fat processed meats such as sausage and bacon  Dairy - Include moderate amounts of low fat dairy  products  - Focus on healthy dairy such as fat free yogurt, skim milk, low or reduced fat cheese - Limit dairy products higher in fat such as whole or 2% milk, cheese, ice cream  Alcohol - Moderate amounts of red wine is ok  - No more than 5 oz daily for women (all ages) and men older than age 57  - No more than 10 oz of wine daily for men younger than 19  Other - Limit sweets and other desserts  - Use herbs and spices instead of salt to flavor foods  - Herbs and spices common to the traditional Mediterranean Diet include: basil, bay leaves, chives, cloves, cumin, fennel, garlic, lavender, marjoram, mint, oregano, parsley, pepper, rosemary, sage, savory, sumac, tarragon, thyme   It's not just a diet, it's a lifestyle:  . The Mediterranean diet includes lifestyle factors typical of those in the region  . Foods, drinks and meals are best eaten with others and savored . Daily physical activity is important for overall good health . This could be strenuous exercise like running and aerobics . This could also be more leisurely activities such as walking, housework, yard-work, or taking the stairs . Moderation is the key; a balanced and healthy diet accommodates most foods and drinks . Consider portion sizes and frequency of consumption of certain foods   Meal Ideas & Options:  . Breakfast:  o Whole wheat toast or whole wheat English muffins with peanut butter & hard boiled egg o Steel cut oats topped with apples & cinnamon and skim milk  o Fresh fruit: banana, strawberries, melon, berries, peaches  o Smoothies: strawberries, bananas, greek yogurt, peanut butter o Low fat greek yogurt with blueberries and granola  o Egg white omelet with spinach and mushrooms o Breakfast couscous: whole wheat couscous, apricots, skim milk, cranberries  . Sandwiches:  o Hummus and grilled vegetables (peppers, zucchini, squash) on whole wheat bread   o Grilled chicken on whole wheat pita with lettuce, tomatoes,  cucumbers or tzatziki  o Tuna salad on whole wheat bread: tuna salad made with greek yogurt, olives, red peppers, capers, green onions o Garlic rosemary lamb pita: lamb sauted with garlic, rosemary, salt & pepper; add lettuce, cucumber, greek yogurt to pita - flavor with lemon juice and black pepper  . Seafood:  o Mediterranean grilled salmon, seasoned with garlic, basil, parsley, lemon juice and black pepper o  Shrimp, lemon, and spinach whole-grain pasta salad made with low fat greek yogurt  o Seared scallops with lemon orzo  o Seared tuna steaks seasoned salt, pepper, coriander topped with tomato mixture of olives, tomatoes, olive oil, minced garlic, parsley, green onions and cappers  . Meats:  o Herbed greek chicken salad with kalamata olives, cucumber, feta  o Red bell peppers stuffed with spinach, bulgur, lean ground beef (or lentils) & topped with feta   o Kebabs: skewers of chicken, tomatoes, onions, zucchini, squash  o [place] burgers: made with red onions, mint, dill, lemon juice, feta cheese topped with roasted red peppers . Vegetarian o Cucumber salad: cucumbers, artichoke hearts, celery, red onion, feta cheese, tossed in olive oil & lemon juice  o Hummus and whole grain pita points with a greek salad (lettuce, tomato, feta, olives, cucumbers, red onion) o Lentil soup with celery, carrots made with vegetable broth, garlic, salt and pepper  o Tabouli salad: parsley, bulgur, mint, scallions, cucumbers, tomato, radishes, lemon juice, olive oil, salt and pepper.      Lab Results  Component Value Date   WBC 6.4 11/02/2014   HGB 12.6 11/02/2014   HCT 37.9 11/02/2014   PLT 218.0 11/02/2014   GLUCOSE 86 11/02/2014   CHOL 149 11/02/2014   TRIG 77.0 11/02/2014   HDL 54.10 11/02/2014   LDLCALC 79 11/02/2014   ALT 13 11/02/2014   AST 12 11/02/2014   NA 139 11/02/2014   K 4.4 11/02/2014   CL 105 11/02/2014   CREATININE 0.65 11/02/2014   BUN 13 11/02/2014   CO2 26 11/02/2014   TSH  2.06 11/02/2014

## 2014-11-16 ENCOUNTER — Encounter: Payer: Self-pay | Admitting: Family Medicine

## 2014-11-16 LAB — TB SKIN TEST: TB SKIN TEST: NEGATIVE

## 2014-11-29 ENCOUNTER — Telehealth: Payer: Self-pay | Admitting: Internal Medicine

## 2014-11-29 NOTE — Telephone Encounter (Signed)
Titer for what?

## 2014-11-29 NOTE — Telephone Encounter (Signed)
Pt need to have a titer test done for school. Can a lab be ordered for this test or does she need to see the doctor

## 2014-11-30 NOTE — Telephone Encounter (Signed)
She said she need the test to show she had the measles

## 2014-12-01 ENCOUNTER — Other Ambulatory Visit: Payer: Self-pay | Admitting: Family Medicine

## 2014-12-01 DIAGNOSIS — Z0184 Encounter for antibody response examination: Secondary | ICD-10-CM

## 2014-12-01 NOTE — Telephone Encounter (Signed)
okto do varicella IGG titer  Dx immunity status no ov needed

## 2014-12-01 NOTE — Telephone Encounter (Signed)
Ok to order varicella titer?

## 2014-12-01 NOTE — Telephone Encounter (Signed)
Lm on Mom vvm to call back and schedule an appt

## 2014-12-01 NOTE — Telephone Encounter (Signed)
I have ordered the varicella titer.  Please schedule the pt.  Thanks!

## 2014-12-01 NOTE — Telephone Encounter (Signed)
Pt said she need to have this test done to show she has had the chick pox . Pt need to have this test done on Monday can a order be put in for this .   My previous message said measles but it is the chicken pox    [phone]

## 2014-12-04 ENCOUNTER — Other Ambulatory Visit (INDEPENDENT_AMBULATORY_CARE_PROVIDER_SITE_OTHER): Payer: 59

## 2014-12-04 DIAGNOSIS — Z0184 Encounter for antibody response examination: Secondary | ICD-10-CM

## 2014-12-05 LAB — VARICELLA ZOSTER ANTIBODY, IGG: VARICELLA IGG: 1319 {index} — AB (ref <135.00)

## 2015-02-01 ENCOUNTER — Ambulatory Visit (INDEPENDENT_AMBULATORY_CARE_PROVIDER_SITE_OTHER): Payer: 59 | Admitting: Family Medicine

## 2015-02-01 ENCOUNTER — Encounter: Payer: Self-pay | Admitting: Family Medicine

## 2015-02-01 VITALS — BP 102/68 | HR 109 | Temp 99.8°F | Wt 257.0 lb

## 2015-02-01 DIAGNOSIS — B9789 Other viral agents as the cause of diseases classified elsewhere: Secondary | ICD-10-CM

## 2015-02-01 DIAGNOSIS — J029 Acute pharyngitis, unspecified: Secondary | ICD-10-CM

## 2015-02-01 DIAGNOSIS — J329 Chronic sinusitis, unspecified: Secondary | ICD-10-CM

## 2015-02-01 DIAGNOSIS — B349 Viral infection, unspecified: Secondary | ICD-10-CM

## 2015-02-01 LAB — POCT RAPID STREP A (OFFICE): RAPID STREP A SCREEN: NEGATIVE

## 2015-02-01 MED ORDER — AMOXICILLIN-POT CLAVULANATE 875-125 MG PO TABS: 1.0000 | ORAL_TABLET | Freq: Two times a day (BID) | ORAL | Status: DC

## 2015-02-01 MED ORDER — METHYLPREDNISOLONE ACETATE 80 MG/ML IJ SUSP
80.0000 mg | Freq: Once | INTRAMUSCULAR | Status: AC
  Administered 2015-02-01: 80 mg via INTRAMUSCULAR

## 2015-02-01 NOTE — Patient Instructions (Signed)
Viral Sinusitis  If symptoms last 10 days or if symptoms get better or then get worse may take antibiotics for this Steroid shot given to help dry up passages and allow drainage to pass more effectively.   Follow up if fevers or shortness of breath or if continue past antibiotics

## 2015-02-01 NOTE — Progress Notes (Signed)
PCP: Lorretta HarpPANOSH,WANDA KOTVAN, MD  Subjective:  Caroline Washington is a 24 y.o. year old very pleasant female patient who presents with sinusitis symptoms including nasal congestion with yellow or green discharge, sinus tenderness mainly maxillary -other symptoms include: prominent sore throat which wakes her up at night -day of illness:4 -started: Monday -Symptoms are worsening -previous treatments: rest, hydration, mucinex -sick contacts/travel/risks: denies flu exposure or other sick contacts  ROS-denies fever, SOB, NVD.   Pertinent Past Medical History-  PCOS, history kidney stones  Medications- reviewed  Current Outpatient Prescriptions  Medication Sig Dispense Refill  . GILDESS FE 1/20 1-20 MG-MCG tablet Take 1 tablet by mouth daily.  1   No current facility-administered medications for this visit.    Objective: BP 102/68 mmHg  Pulse 109  Temp(Src) 99.8 F (37.7 C)  Wt 257 lb (116.574 kg)  SpO2 94%  HR on recheck was 96 Gen: NAD, resting comfortably HEENT: Turbinates erythematous with yellow drainage, TM normal, pharynx mildly erythematous with enlarged tonsils with white exudate, bilateral maxillary sinus tenderness CV: RRR no murmurs rubs or gallops Lungs: CTAB no crackles, wheeze, rhonchi Abdomen: soft/nontender/nondistended/normal bowel sounds. No rebound or guarding.  Ext: no edema Skin: warm, dry, no rash Neuro: grossly normal, moves all extremities  Results for orders placed or performed in visit on 02/01/15 (from the past 24 hour(s))  POC Rapid Strep A     Status: None   Collection Time: 02/01/15  2:15 PM  Result Value Ref Range   Rapid Strep A Screen Negative Negative   Assessment/Plan:  Sinsusitis  Viral based on <10 days, no double sickening, lack of severity of symptoms in first 3 days. We also discussed reasons why current illness does not meet criteria for bacterial illness and therefore no antibiotics indicated. Also educated on signs that bacterial  infection may have developed.   I was concerned about her exudate on tonsils but rapid strep negative. Sent for culture. Also temperature curve appears to be elevating at 99.. Doubt sats 94% given very well appearing and no respiratory concerns.   Treatment: -considered steroid: patient opted in: depo medrol 80mg /cc with 1 cc given -other symptomatic care with continued mucinex -Antibiotic indicated: no, but given Holiday weekend prescription was given to use if double sickening or if symptoms persist through Tuesday. Also told patient if febrile she could pick this up.   Finally, we reviewed reasons to return to care including if symptoms worsen or persist or new concerns arise.  Meds ordered this encounter  Medications  . amoxicillin-clavulanate (AUGMENTIN) 875-125 MG tablet    Sig: Take 1 tablet by mouth 2 (two) times daily. May pick up if symptoms improve then worsen again or if symptoms persist through 02/13/15    Dispense:  14 tablet    Refill:  0  . methylPREDNISolone acetate (DEPO-MEDROL) injection 80 mg    Sig:

## 2015-02-03 LAB — CULTURE, GROUP A STREP: ORGANISM ID, BACTERIA: NORMAL

## 2015-11-02 ENCOUNTER — Ambulatory Visit: Payer: 59 | Admitting: Family Medicine

## 2015-11-07 ENCOUNTER — Ambulatory Visit (INDEPENDENT_AMBULATORY_CARE_PROVIDER_SITE_OTHER): Payer: 59 | Admitting: Family Medicine

## 2015-11-07 DIAGNOSIS — Z23 Encounter for immunization: Secondary | ICD-10-CM

## 2015-11-07 DIAGNOSIS — Z111 Encounter for screening for respiratory tuberculosis: Secondary | ICD-10-CM

## 2015-11-07 NOTE — Progress Notes (Signed)
Patient presented today for a TB Skin Test and Flu vaccination.  Tb test was administered in her right arm.  Patient tolerated well and will return in 48 hours for a TB Skin Reading.

## 2015-11-09 LAB — TB SKIN TEST
INDURATION: 0 mm
TB SKIN TEST: NEGATIVE

## 2015-11-13 ENCOUNTER — Telehealth: Payer: Self-pay | Admitting: Internal Medicine

## 2015-11-13 NOTE — Telephone Encounter (Signed)
I am unable to give TB skin test at 8 AM.  I arrive at 8 AM.  Please have pt keep her appt for 10 AM.

## 2015-11-13 NOTE — Telephone Encounter (Signed)
° ° °  Mom asked if you could please do her tb test at 8am on 11/16/15. I did put her in your 10am slot   because she has to be in class by 9am

## 2015-11-13 NOTE — Telephone Encounter (Signed)
There is a 4 PM appt on Friday if that will help

## 2015-11-16 ENCOUNTER — Ambulatory Visit (INDEPENDENT_AMBULATORY_CARE_PROVIDER_SITE_OTHER): Payer: 59 | Admitting: Family Medicine

## 2015-11-16 DIAGNOSIS — Z111 Encounter for screening for respiratory tuberculosis: Secondary | ICD-10-CM | POA: Diagnosis not present

## 2015-11-19 LAB — TB SKIN TEST: TB Skin Test: NEGATIVE

## 2015-12-05 ENCOUNTER — Ambulatory Visit: Payer: 59 | Admitting: Family Medicine

## 2015-12-09 ENCOUNTER — Emergency Department (HOSPITAL_BASED_OUTPATIENT_CLINIC_OR_DEPARTMENT_OTHER)
Admission: EM | Admit: 2015-12-09 | Discharge: 2015-12-09 | Disposition: A | Discharge status: Home / Self Care | Payer: 59 | Attending: Emergency Medicine | Admitting: Emergency Medicine

## 2015-12-09 ENCOUNTER — Encounter (HOSPITAL_BASED_OUTPATIENT_CLINIC_OR_DEPARTMENT_OTHER): Payer: Self-pay | Admitting: *Deleted

## 2015-12-09 DIAGNOSIS — R002 Palpitations: Secondary | ICD-10-CM | POA: Diagnosis present

## 2015-12-09 DIAGNOSIS — R5383 Other fatigue: Secondary | ICD-10-CM | POA: Diagnosis not present

## 2015-12-09 DIAGNOSIS — E860 Dehydration: Secondary | ICD-10-CM | POA: Diagnosis not present

## 2015-12-09 LAB — URINALYSIS, ROUTINE W REFLEX MICROSCOPIC
BILIRUBIN URINE: NEGATIVE
Glucose, UA: NEGATIVE mg/dL
Hgb urine dipstick: NEGATIVE
Ketones, ur: 15 mg/dL — AB
Leukocytes, UA: NEGATIVE
NITRITE: NEGATIVE
PROTEIN: NEGATIVE mg/dL
SPECIFIC GRAVITY, URINE: 1.028 (ref 1.005–1.030)
pH: 6 (ref 5.0–8.0)

## 2015-12-09 LAB — CBC WITH DIFFERENTIAL/PLATELET
BASOS ABS: 0 10*3/uL (ref 0.0–0.1)
BASOS PCT: 0 %
Eosinophils Absolute: 0.1 10*3/uL (ref 0.0–0.7)
Eosinophils Relative: 1 %
HEMATOCRIT: 38.9 % (ref 36.0–46.0)
HEMOGLOBIN: 12.8 g/dL (ref 12.0–15.0)
Lymphocytes Relative: 36 %
Lymphs Abs: 2.9 10*3/uL (ref 0.7–4.0)
MCH: 27.1 pg (ref 26.0–34.0)
MCHC: 32.9 g/dL (ref 30.0–36.0)
MCV: 82.2 fL (ref 78.0–100.0)
Monocytes Absolute: 0.7 10*3/uL (ref 0.1–1.0)
Monocytes Relative: 8 %
NEUTROS ABS: 4.5 10*3/uL (ref 1.7–7.7)
NEUTROS PCT: 55 %
Platelets: 215 10*3/uL (ref 150–400)
RBC: 4.73 MIL/uL (ref 3.87–5.11)
RDW: 14.2 % (ref 11.5–15.5)
WBC: 8.2 10*3/uL (ref 4.0–10.5)

## 2015-12-09 LAB — BASIC METABOLIC PANEL
ANION GAP: 8 (ref 5–15)
BUN: 13 mg/dL (ref 6–20)
CALCIUM: 9.4 mg/dL (ref 8.9–10.3)
CHLORIDE: 102 mmol/L (ref 101–111)
CO2: 26 mmol/L (ref 22–32)
Creatinine, Ser: 0.69 mg/dL (ref 0.44–1.00)
GFR calc non Af Amer: >60 mL/min (ref >60)
Glucose, Bld: 89 mg/dL (ref 65–99)
Potassium: 3.7 mmol/L (ref 3.5–5.1)
SODIUM: 136 mmol/L (ref 135–145)

## 2015-12-09 LAB — TSH: TSH: 0.823 u[IU]/mL (ref 0.350–4.500)

## 2015-12-09 LAB — MAGNESIUM: MAGNESIUM: 2 mg/dL (ref 1.7–2.4)

## 2015-12-09 LAB — PREGNANCY, URINE: PREG TEST UR: NEGATIVE

## 2015-12-09 MED ORDER — SODIUM CHLORIDE 0.9 % IV BOLUS (SEPSIS)
1000.0000 mL | Freq: Once | INTRAVENOUS | Status: AC
  Administered 2015-12-09: 1000 mL via INTRAVENOUS

## 2015-12-09 NOTE — ED Triage Notes (Signed)
Pt c/o palpitations  X 4 days off and on

## 2015-12-09 NOTE — ED Provider Notes (Signed)
[facility] Note   CSN: [id number] Arrival date & time: 12/09/15  1737   By signing my name below, I, Teofilo Pod, attest that this documentation has been prepared under the direction and in the presence of Shaune Pollack, MD . Electronically Signed: Teofilo Pod, ED Scribe. 12/09/2015. 6:51 PM.   History   Chief Complaint Chief Complaint  Patient presents with  . Palpitations    The history is provided by the patient. No language interpreter was used.   HPI Comments:  Caroline Washington is a 25 y.o. female with PMHx of PCOS who presents to the Emergency Department complaining of intermittent palpitations x 4 days. Pt states that she has been training for a 10K, and recently increased her training mileage to 5.5 mile runs. Pt states that her rest heart rate increased to 106 after her last run and persistent for two hours. Pt notes that she will intermittently have short tachycardic episodes to the low 100s, usually after running. LNMP was 3 weeks. No alleviating factors noted. Pt denies any recent medication changes. Pt denies chest pain. No family history of arrhythmia.   Past Medical History:  Diagnosis Date  . Calcium oxalate renal stones    If if calcium   . History of varicella   . PCO (polycystic ovaries)    hair and irrg periods ? [place]  . Urinary frequency 11/07   no cause    Patient Active Problem List   Diagnosis Date Noted  . PCOS (polycystic ovarian syndrome) 07/31/2014  . Hx of urinary stone 07/31/2014  . Eyelid inflammation 01/14/2011  . Visit for preventive health examination 01/14/2011  . Folliculitis 10/08/2010  . Heel pain 05/20/2010  . Calcium oxalate renal stones   . Renal stone 04/18/2010  . OBESITY 08/17/2009  . POLYCYSTIC OVARIAN DISEASE 08/28/2008  . FREQUENCY, URINARY 08/25/2008  . CHICKENPOX, HX OF 08/25/2008    Past Surgical History:  Procedure Laterality Date  . TYMPANOSTOMY TUBE PLACEMENT      OB History     Gravida Para Term Preterm AB Living   0 0 0 0 0 0   SAB TAB Ectopic Multiple Live Births   0 0 0 0         Home Medications    Prior to Admission medications   Not on File    Family History Family History  Problem Relation Age of Onset  . Hypertension Mother   . Depression Mother   . OCD Mother   . Bipolar disorder Mother   . Hypertension Father     Social History Social History  Substance Use Topics  . Smoking status: Never Smoker  . Smokeless tobacco: Never Used  . Alcohol use No     Allergies   Review of patient's allergies indicates no known allergies.   Review of Systems Review of Systems  Constitutional: Positive for fatigue. Negative for chills and fever.  HENT: Negative for congestion, rhinorrhea and sore throat.   Eyes: Negative for visual disturbance.  Respiratory: Negative for cough, shortness of breath and wheezing.   Cardiovascular: Positive for palpitations. Negative for chest pain and leg swelling.  Gastrointestinal: Negative for abdominal pain, diarrhea, nausea and vomiting.  Genitourinary: Negative for dysuria, flank pain, vaginal bleeding and vaginal discharge.  Musculoskeletal: Negative for neck pain.  Skin: Negative for rash.  Allergic/Immunologic: Negative for immunocompromised state.  Neurological: Negative for syncope and headaches.  Hematological: Does not bruise/bleed easily.  All other systems reviewed and  are negative.    Physical Exam Updated Vital Signs BP 116/72 (BP Location: Left Arm)   Pulse 86   Temp 97.6 F (36.4 C) (Oral)   Resp 20   Ht 5\' 9"  (1.753 m)   Wt 265 lb (120.2 kg)   LMP 11/18/2015   SpO2 100%   BMI 39.13 kg/m   Physical Exam  Constitutional: She is oriented to person, place, and time. She appears well-developed and well-nourished. No distress.  HENT:  Head: Normocephalic and atraumatic.  Eyes: Conjunctivae are normal.  Neck: Neck supple.  Cardiovascular: Normal rate, regular rhythm and normal  heart sounds.  Exam reveals no friction rub.   No murmur heard. Pulmonary/Chest: Effort normal and breath sounds normal. No respiratory distress. She has no wheezes. She has no rales.  Abdominal: She exhibits no distension.  Musculoskeletal: She exhibits no edema.  Neurological: She is alert and oriented to person, place, and time. She exhibits normal muscle tone.  Skin: Skin is warm. Capillary refill takes less than 2 seconds.  Psychiatric: She has a normal mood and affect.  Nursing note and vitals reviewed.    ED Treatments / Results  DIAGNOSTIC STUDIES:  Oxygen Saturation is 98% on RA, normal by my interpretation.    COORDINATION OF CARE:  6:51 PM Discussed treatment plan with pt at bedside and pt agreed to plan.   Labs (all labs ordered are listed, but only abnormal results are displayed) Labs Reviewed  URINALYSIS, ROUTINE W REFLEX MICROSCOPIC (NOT AT [facility]) - Abnormal; Notable for the following:       Result Value   APPearance CLOUDY (*)    Ketones, ur 15 (*)    All other components within normal limits  CBC WITH DIFFERENTIAL/PLATELET  BASIC METABOLIC PANEL  MAGNESIUM  PREGNANCY, URINE  TSH    EKG  EKG Interpretation  Date/Time:  Sunday December 09 2015 17:43:37 EDT Ventricular Rate:  85 PR Interval:  156 QRS Duration: 90 QT Interval:  372 QTC Calculation: 442 R Axis:   56 Text Interpretation:  Normal sinus rhythm Normal ECG No old tracing to compare Confirmed by Atavia Poppe MD, Zuri Lascala [phone]([id number]) on 12/10/2015 12:45:56 AM       Radiology No results found.  Procedures Procedures (including critical care time)  Medications Ordered in ED Medications  sodium chloride 0.9 % bolus 1,000 mL (0 mLs Intravenous Stopped 12/09/15 2042)     Initial Impression / Assessment and Plan / ED Course  I have reviewed the triage vital signs and the nursing notes.  Pertinent labs & imaging results that were available during my care of the patient were reviewed by me and  considered in my medical decision making (see chart for details).  Clinical Course  25 year old female with past medical history of PCOS who presents with subjective regular palpitations and reported increase in baseline heart rate from the 60s to 80s as well as transient tachycardia in the setting of increasing her running distance. Patient is currently training for a 10K. On arrival here, patient is in normal sinus rhythm with normal intervals on EKG. No evidence of Brugada, WPW, or arrhythmogenic abnormality. I suspect her symptoms are due to dehydration and increased physical activity. CBC shows no anemia. BMP is without electrolyte abnormality. Urinalysis shows mild ketonuria consistent with dehydration. Patient is otherwise hemodynamically stable. Baseline heart rate has improved mildly with IV fluids, although she has had no evidence of tachycardia in the ED. Will discharge with encourage fluids, monitoring of heart rate during exercise,  and outpatient follow-up. Return precautions given.   Final Clinical Impressions(s) / ED Diagnoses   Final diagnoses:  Dehydration  Palpitations    New Prescriptions There are no discharge medications for this patient.   I personally performed the services described in this documentation, which was scribed in my presence. The recorded information has been reviewed and is accurate.     Shaune Pollackameron Shloime Keilman, MD 12/10/15 [phone]

## 2015-12-09 NOTE — ED Notes (Signed)
Waiting for IVF to infuse before D/C'ed home. ED MD in to see

## 2015-12-09 NOTE — ED Triage Notes (Signed)
States heart racing intermittently  Past 2-3 days    Denies chest pain but does feel like there's a "lump" in throat

## 2015-12-09 NOTE — ED Notes (Signed)
States has felt like heart has been racing after completing a run on Thursday and brief episodes of same over the weekend.

## 2015-12-10 ENCOUNTER — Telehealth: Payer: Self-pay | Admitting: Internal Medicine

## 2015-12-10 NOTE — Telephone Encounter (Signed)
Pt was notified in ED to have out patient follow up.  No time fram was specified.  When would you like to see the pt for ED follow up?

## 2015-12-10 NOTE — Telephone Encounter (Signed)
Pt was severely dehydrated and was giving fluids and was told to drink plenty of water.

## 2015-12-10 NOTE — Telephone Encounter (Signed)
Depends on how doing  But  reviewed the ed visit and seems that palpitations were the  Symptom of concern  Tomorrow afternoon  30 minutes   Or  Thursday 30 minutes       Clinical Course  25 year old female with past medical history of PCOS who presents with subjective regular palpitations and reported increase in baseline heart rate from the 60s to 80s as well as transient tachycardia in the setting of increasing her running distance. Patient is currently training for a 10K. On arrival here, patient is in normal sinus rhythm with normal intervals on EKG. No evidence of Brugada, WPW, or arrhythmogenic abnormality. I suspect her symptoms are due to dehydration and increased physical activity. CBC shows no anemia. BMP is without electrolyte abnormality. Urinalysis shows mild ketonuria consistent with dehydration. Patient is otherwise hemodynamically stable. Baseline heart rate has improved mildly with IV fluids, although she has had no evidence of tachycardia in the ED. Will discharge with encourage fluids, monitoring of heart rate during exercise, and outpatient follow-up. Return precautions given.    Lab Results  Component Value Date   WBC 8.2 12/09/2015   HGB 12.8 12/09/2015   HCT 38.9 12/09/2015   PLT 215 12/09/2015   GLUCOSE 89 12/09/2015   CHOL 149 11/02/2014   TRIG 77.0 11/02/2014   HDL 54.10 11/02/2014   LDLCALC 79 11/02/2014   ALT 13 11/02/2014   AST 12 11/02/2014   NA 136 12/09/2015   K 3.7 12/09/2015   CL 102 12/09/2015   CREATININE 0.69 12/09/2015   BUN 13 12/09/2015   CO2 26 12/09/2015   TSH 0.823 12/09/2015

## 2015-12-10 NOTE — Telephone Encounter (Signed)
Dr. Fabian SharpPanosh said please schedule Thurs if ok w/patient.

## 2015-12-11 NOTE — Telephone Encounter (Signed)
Pt scheduled  

## 2015-12-11 NOTE — Telephone Encounter (Signed)
Called and spoke with the pts mother and she state that the patient is in school and will check with the pt to see what her scheduled is and will call the office back to schedule.

## 2015-12-12 NOTE — Progress Notes (Signed)
Pre visit review using our clinic review tool, if applicable. No additional management support is needed unless otherwise documented below in the visit note.  Chief Complaint  Patient presents with  . Follow-up    ED    HPI: Caroline Washington 25 y.o.   Fu ED visit  for palpitations   Felt to be from dehydration She had been very busy and a small amount of sleep the night before had gone out and had 1-2 beers have been in training for a 10K easily doing 4-1/2 miles but when tried 5-1/2 felt more tired. Had some clinicals studying research and less by mouth fluid intake that day and about 05/14/1927 was sitting at the kitchen table and felt that her heart was pounding rate might of been about 110. Because of the family history of heart disease mom encourage her to go the emergency room. She was evaluated felt to have mild dehydration lab and ekg normal  She agrees with the assessment. This morning she noted when she was  taking of make up  a small knot in the right preauricular area without tenderness. She does have cartilage piercing but no discharge redness pain in her ear. No fever or sore throat is noted to have large tonsils. ROS: See pertinent positives and negatives per HPI.  Didn't train this week after ed visit is drinking more water  And feels ok except baseline hr is in 70s instead of 60s.   Past Medical History:  Diagnosis Date  . Calcium oxalate renal stones    If if calcium   . History of varicella   . PCO (polycystic ovaries)    hair and irrg periods ? [place]  . Urinary frequency 11/07   no cause    Family History  Problem Relation Age of Onset  . Hypertension Mother   . Depression Mother   . OCD Mother   . Bipolar disorder Mother   . Hypertension Father     Social History   Social History  . Marital status: Single    Spouse name: N/A  . Number of children: N/A  . Years of education: N/A   Social History Main Topics  . Smoking status: Never Smoker  . Smokeless  tobacco: Never Used  . Alcohol use No  . Drug use: No  . Sexual activity: Not Asked   Other Topics Concern  . None   Social History Narrative   Father 575'10'   Mother 5'9"   Caffeine: Coke twice weekly and coffee everyday.   HH of 3   2 cats and 3 dogs   Parents: Jasmine DecemberSharon and Rayvon CharWilliam Ronald   Sleep 7 hours      [employer]   Sports med .     Graduated recently from Kinesiology   Looking into PT school    No outpatient prescriptions prior to visit.   No facility-administered medications prior to visit.      EXAM:  BP 106/70 (BP Location: Right Arm, Patient Position: Sitting, Cuff Size: Large)   Temp 98.3 F (36.8 C) (Oral)   Wt 262 lb 12.8 oz (119.2 kg)   LMP 11/18/2015   BMI 38.81 kg/m   Body mass index is 38.81 kg/m.  GENERAL: vitals reviewed and listed above, alert, oriented, appears well hydrated and in no acute distress HEENT: atraumatic, conjunctiva  clear, no obvious abnormalities on inspection of external nose and earsscarring tms  No acute findings    Right ear piercing mild erythema no infection  OP : no lesion edema or exudate tonsils hypertrophied  2+   NECK: no obvious masses on inspection palpation   Pre auricular  Large pea  moblie non tender node/ vs cyst   LUNGS: clear to auscultation bilaterally, no wheezes, rales or rhonchi, good air movement CV: HRRR, no g or m no clubbing cyanosis or  peripheral edema nl cap refill  MS: moves all extremities without noticeable focal  abnormality PSYCH: pleasant and cooperative, no obvious depression or anxiety  ekg reviewed and copy to patient Lab Results  Component Value Date   WBC 8.2 12/09/2015   HGB 12.8 12/09/2015   HCT 38.9 12/09/2015   PLT 215 12/09/2015   GLUCOSE 89 12/09/2015   CHOL 149 11/02/2014   TRIG 77.0 11/02/2014   HDL 54.10 11/02/2014   LDLCALC 79 11/02/2014   ALT 13 11/02/2014   AST 12 11/02/2014   NA 136 12/09/2015   K 3.7 12/09/2015   CL 102 12/09/2015   CREATININE 0.69 12/09/2015   BUN  13 12/09/2015   CO2 26 12/09/2015   TSH 0.823 12/09/2015   ucg neg Wt Readings from Last 3 Encounters:  12/13/15 262 lb 12.8 oz (119.2 kg)  12/09/15 265 lb (120.2 kg)  02/01/15 257 lb (116.6 kg)    ASSESSMENT AND PLAN:  Discussed the following assessment and plan:  Pounding heartbeat episode - epidosde not related to exercise  ed visit reassuring had dec sleep hydration and had ketones in urine  no recurrance  doubht arryhtmia but fu if recurring   Preauricular adenopathy ? vs cyst - benign feeling prob reactive  follow and  recheck ir progressive  sx etc  no other adenopathy on exam   -Patient advised to return or notify health care team  if symptoms worsen ,persist or new concerns arise.  Patient Instructions   Ok to do race  But if feel extra fatigue   Stop   If pounding heart is recurrent then plan ROV.   Stay hydrated and get sleep .   The knot in formo f your ear seems to be a gland  Follow   If  Fever and not going away in a week or so  Of getting a lot worse let us know.   Lab Results  Component Value Date   WBC 8.2 12/09/2015   HGB 12.8 12/09/2015   HCT 38.9 12/09/2015   PLT 215 12/09/2015   GLUCOSE 89 12/09/2015   CHOL 149 11/02/2014   TRIG 77.0 11/02/2014   HDL 54.10 11/02/2014   LDLCALC 79 11/02/2014   ALT 13 11/02/2014   AST 12 11/02/2014   NA 136 12/09/2015   K 3.7 12/09/2015   CL 102 12/09/2015   CREATININE 0.69 12/09/2015   BUN 13 12/09/2015   CO2 26 12/09/2015   TSH 0.823 12/09/2015       Wanda K. Panosh M.D.

## 2015-12-13 ENCOUNTER — Encounter: Payer: Self-pay | Admitting: Internal Medicine

## 2015-12-13 ENCOUNTER — Ambulatory Visit (INDEPENDENT_AMBULATORY_CARE_PROVIDER_SITE_OTHER): Payer: 59 | Admitting: Internal Medicine

## 2015-12-13 VITALS — BP 106/70 | Temp 98.3°F | Wt 262.8 lb

## 2015-12-13 DIAGNOSIS — R590 Localized enlarged lymph nodes: Secondary | ICD-10-CM | POA: Diagnosis not present

## 2015-12-13 DIAGNOSIS — R002 Palpitations: Secondary | ICD-10-CM

## 2015-12-13 NOTE — Patient Instructions (Signed)
Ok to do race  But if feel extra fatigue   Stop   If pounding heart is recurrent then plan ROV.   Stay hydrated and get sleep .   The knot in formo f your ear seems to be a gland  Follow   If  Fever and not going away in a week or so  Of getting a lot worse let us know.   Lab Results  Component Value Date   WBC 8.2 12/09/2015   HGB 12.8 12/09/2015   HCT 38.9 12/09/2015   PLT 215 12/09/2015   GLUCOSE 89 12/09/2015   CHOL 149 11/02/2014   TRIG 77.0 11/02/2014   HDL 54.10 11/02/2014   LDLCALC 79 11/02/2014   ALT 13 11/02/2014   AST 12 11/02/2014   NA 136 12/09/2015   K 3.7 12/09/2015   CL 102 12/09/2015   CREATININE 0.69 12/09/2015   BUN 13 12/09/2015   CO2 26 12/09/2015   TSH 0.823 12/09/2015

## 2016-01-17 ENCOUNTER — Encounter: Payer: Self-pay | Admitting: Family Medicine

## 2016-01-17 ENCOUNTER — Ambulatory Visit (INDEPENDENT_AMBULATORY_CARE_PROVIDER_SITE_OTHER): Payer: 59 | Admitting: Family Medicine

## 2016-01-17 VITALS — BP 106/84 | HR 72 | Temp 98.3°F | Wt 266.0 lb

## 2016-01-17 DIAGNOSIS — L731 Pseudofolliculitis barbae: Secondary | ICD-10-CM

## 2016-01-17 MED ORDER — CEPHALEXIN 500 MG PO CAPS: 500.0000 mg | ORAL_CAPSULE | Freq: Four times a day (QID) | ORAL | 0 refills | Status: DC

## 2016-01-17 NOTE — Progress Notes (Signed)
Pre visit review using our clinic review tool, if applicable. No additional management support is needed unless otherwise documented below in the visit note. 

## 2016-01-17 NOTE — Progress Notes (Signed)
Subjective:    Patient ID: Ignacia PalmaHaley A Keeler, female    DOB: 24-Aug-1990, 25 y.o.   MRN: [id number]  HPI  Ms. Donalee CitrinBecker is a 25 year old female who presents today with an ingrown hair in her left axillae that presented two weeks ago. She reports a history of a cyst that has been present for 6 to 7 years in this area;she is followed by dermatology for this issue. Two weeks ago, she noticed after shaving that this area became red, swollen, and painful. She used a needle to open this area which she described as "quarter size."  Drainage reported to be "bloody pus" that drained for two to three days but has stopped. Today, she is seeking evaluation of site which she reports has improved but she cannot evaluate if hair is visible.  She denies fever, chills, sweats, pain, warmth, erythema, swelling, and drainage at this time.  Review of Systems  Constitutional: Negative for chills and fever.  Respiratory: Negative for cough and wheezing.   Cardiovascular: Negative for chest pain and palpitations.  Gastrointestinal: Negative for abdominal pain, diarrhea, nausea and vomiting.  Musculoskeletal: Negative for myalgias.  Skin:       Ingrown hair in left axillae  Neurological: Negative for dizziness.   Past Medical History:  Diagnosis Date  . Calcium oxalate renal stones    If if calcium   . History of varicella   . PCO (polycystic ovaries)    hair and irrg periods ? [place]  . Urinary frequency 11/07   no cause     Social History   Social History  . Marital status: Single    Spouse name: N/A  . Number of children: N/A  . Years of education: N/A   Occupational History  . Not on file.   Social History Main Topics  . Smoking status: Never Smoker  . Smokeless tobacco: Never Used  . Alcohol use No  . Drug use: No  . Sexual activity: Not on file   Other Topics Concern  . Not on file   Social History Narrative   Father 665'10'   Mother 5'9"   Caffeine: Coke twice weekly and coffee everyday.   HH of 3   2 cats and 3 dogs   Parents: Jasmine DecemberSharon and Rayvon CharWilliam Ronald   Sleep 7 hours      [employer]   Sports med .     Graduated recently from [employer]   Looking into PT school    Past Surgical History:  Procedure Laterality Date  . TYMPANOSTOMY TUBE PLACEMENT      Family History  Problem Relation Age of Onset  . Hypertension Mother   . Depression Mother   . OCD Mother   . Bipolar disorder Mother   . Hypertension Father     No Known Allergies  Current Outpatient Prescriptions on File Prior to Visit  Medication Sig Dispense Refill  . clindamycin (CLEOCIN T) 1 % lotion      No current facility-administered medications on file prior to visit.     BP 106/84 (BP Location: Left Arm, Patient Position: Sitting, Cuff Size: Large)   Pulse 72   Temp 98.3 F (36.8 C) (Oral)   Wt 266 lb (120.7 kg)   SpO2 97%   BMI 39.28 kg/m        Objective:   Physical Exam  Constitutional: She appears well-developed and well-nourished.  Eyes: Pupils are equal, round, and reactive to light. No scleral icterus.  Neck: Neck supple.  Cardiovascular: Normal rate and regular rhythm.   Pulmonary/Chest: Effort normal and breath sounds normal. She has no wheezes. She has no rales.  Lymphadenopathy:    She has no cervical adenopathy.  Skin: Skin is warm and dry.  Pea size raised area with open healing area from drainage with a needle without redness, warmth, or pain present. No drainage noted in this area. Visible hair noted in center that appears to be an ingrown hair that is now above skin after patient opened the area and drainage occurred.        Assessment & Plan:  1. Ingrown hair Healing area from incision and drainage of ingrown hair that patient completed with a needle. Will provide Keflex today to prevent infection during the healing process. Advised patient to follow up with her dermatologist for evaluation of cyst removal as this may be something that reoccurs on a regular basis with  shaving. Follow up if with provider if area does not continue to heal, worsens, or she develops a fever or drainage from the area. - cephALEXin (KEFLEX) 500 MG capsule; Take 1 capsule (500 mg total) by mouth 4 (four) times daily.  Dispense: 28 capsule; Refill: 0  Roddie McJulia Kasia Trego, FNP-C

## 2016-01-17 NOTE — Patient Instructions (Signed)
Please take medication as directed and follow up with your dermatologist for evaluation of cyst removal. Follow up with your provider if symptoms do not continue to improve, worsen, or you develop a fever >100.   Ingrown Hair An ingrown hair is a hair that curls and re-enters the skin instead of growing straight out of the skin. It happens most often with curly hair. It is usually more severe in the neck area, but it can occur in any shaved area, including the beard area, groin, scalp, and legs. An ingrown hair may cause small pockets of infection. CAUSES  Shaving closely, tweezing, or waxing, especially curly hair. Using hair removal creams can sometimes lead to ingrown hairs, especially in the groin. SYMPTOMS   Small bumps on the skin. The bumps may be filled with pus.  Pain.  Itching. DIAGNOSIS  Your caregiver can usually tell what is wrong by doing a physical exam. TREATMENT  If there is a severe infection, your caregiver may prescribe antibiotic medicines. Laser hair removal may also be done to help prevent regrowth of the hair. HOME CARE INSTRUCTIONS   Do not shave irritated skin. You may start shaving again once the irritation has gone away.  If you are prone to ingrown hairs, consider not shaving as much as possible.  If antibiotics are prescribed, take them as directed. Finish them even if you start to feel better.  You may use a facial sponge in a gentle circular motion to help dislodge ingrown hairs on the face.  You may use a hair removal cream weekly, especially on the legs and underarms. Stop using the cream if it irritates your skin. Use caution when using hair removal creams in the groin area. SHAVING INSTRUCTIONS AFTER TREATMENT  Shower before shaving. Keep areas to be shaved packed in warm, moist wraps for several minutes before shaving. The warm, moist environment helps soften the hairs and makes ingrown hairs less likely to occur.  Use thick shaving gels.  Use a  bump fighter razor that cuts hair slightly above the skin level or use an electric shaver with a longer shave setting.  Shave in the direction of hair growth. Avoid making multiple razor strokes.  Use moisturizing lotions after shaving. This information is not intended to replace advice given to you by your health care provider. Make sure you discuss any questions you have with your health care provider. Document Released: 05/05/2000 Document Revised: 07/29/2011 Document Reviewed: 11/17/2014 Elsevier Interactive Patient Education  2017 [employer]

## 2016-02-11 HISTORY — PX: TONSILLECTOMY: SUR1361

## 2016-02-16 ENCOUNTER — Emergency Department (HOSPITAL_BASED_OUTPATIENT_CLINIC_OR_DEPARTMENT_OTHER)
Admission: EM | Admit: 2016-02-16 | Discharge: 2016-02-16 | Disposition: A | Discharge status: Home / Self Care | Payer: 59 | Attending: Emergency Medicine | Admitting: Emergency Medicine

## 2016-02-16 ENCOUNTER — Encounter (HOSPITAL_BASED_OUTPATIENT_CLINIC_OR_DEPARTMENT_OTHER): Payer: Self-pay | Admitting: Emergency Medicine

## 2016-02-16 DIAGNOSIS — M25551 Pain in right hip: Secondary | ICD-10-CM | POA: Diagnosis present

## 2016-02-16 DIAGNOSIS — M79651 Pain in right thigh: Secondary | ICD-10-CM | POA: Insufficient documentation

## 2016-02-16 MED ORDER — NAPROXEN 500 MG PO TABS: 500.0000 mg | ORAL_TABLET | Freq: Two times a day (BID) | ORAL | 0 refills | Status: DC

## 2016-02-16 NOTE — Discharge Instructions (Signed)
Please read and follow all provided instructions.  Your diagnoses today include:  1. Right thigh pain     Tests performed today include:  Vital signs. See below for your results today.   Medications prescribed:   Naproxen - anti-inflammatory pain medication  Do not exceed 500mg  naproxen every 12 hours, take with food  You have been prescribed an anti-inflammatory medication or NSAID. Take with food. Take smallest effective dose for the shortest duration needed for your pain. Stop taking if you experience stomach pain or vomiting.   Take any prescribed medications only as directed.  Home care instructions:   Follow any educational materials contained in this packet  Follow R.I.C.E. Protocol:  R - rest your injury   I  - use ice on injury without applying directly to skin  C - compress injury with bandage or splint  E - elevate the injury as much as possible  Follow-up instructions: Please follow-up with your primary care provider or the provided orthopedic physician (bone specialist) if you continue to have significant pain in 1 week. In this case you may have a more severe injury that requires further care.   Return instructions:   Please return if your toes or feet are numb or tingling, appear gray or blue, or you have severe pain (also elevate the leg and loosen splint or wrap if you were given one)  Please return to the Emergency Department if you experience worsening symptoms.   Please return if you have any other emergent concerns.  Additional Information:  Your vital signs today were: BP 162/80 (BP Location: Left Arm)    Pulse 77    Temp 98.1 F (36.7 C) (Oral)    Resp 18    Ht 5\' 9"  (1.753 m)    Wt 117.9 kg    LMP 02/15/2016 (Exact Date)    SpO2 100%    BMI 38.40 kg/m  If your blood pressure (BP) was elevated above 135/85 this visit, please have this repeated by your doctor within one month. -------------- If prescribed crutches for your injury: use  crutches with non-weight bearing for the first few days. Then, you may walk as the pain allows, or as instructed. Start gradually with weight bearing on the affected side. Once you can walk pain free, then try jogging. When you can run forwards, then you can try moving side-to-side. If you cannot walk without crutches in one week, you need a re-check. --------------

## 2016-02-16 NOTE — ED Provider Notes (Signed)
[facility] Note   CSN: [id number] Arrival date & time: 02/16/16  1630   By signing my name below, I, Soijett Blue, attest that this documentation has been prepared under the direction and in the presence of Rhea Bleacher, PA-C Electronically Signed: Soijett Blue, ED Scribe. 02/16/16. 7:24 PM.  History   Chief Complaint Chief Complaint  Patient presents with  . Hip Pain    HPI Caroline Washington is a 26 y.o. female who presents to the Emergency Department complaining of gradually worsening right hip pain onset 4 days ago. Pt notes that she recently started clinicals for school and noticed that when she turned to her right there was a burning sensation to her right hip. She has tried ice, anti-inflammatory medication, and icy-hot with no relief of her symptoms. She denies weakness, gait problem, swelling, color change, wound, and any other symptoms. Denies hx of sickle cell or recent steroid use. Patient denies warning symptoms of back pain including: fecal incontinence, urinary retention or overflow incontinence, night sweats, waking from sleep with back pain, unexplained fevers or weight loss, h/o cancer, IVDU, recent trauma.      The history is provided by the patient. No language interpreter was used.    Past Medical History:  Diagnosis Date  . Calcium oxalate renal stones    If if calcium   . History of varicella   . PCO (polycystic ovaries)    hair and irrg periods ? [place]  . Urinary frequency 11/07   no cause    Patient Active Problem List   Diagnosis Date Noted  . PCOS (polycystic ovarian syndrome) 07/31/2014  . Hx of urinary stone 07/31/2014  . Eyelid inflammation 01/14/2011  . Visit for preventive health examination 01/14/2011  . Folliculitis 10/08/2010  . Heel pain 05/20/2010  . Calcium oxalate renal stones   . Renal stone 04/18/2010  . OBESITY 08/17/2009  . POLYCYSTIC OVARIAN DISEASE 08/28/2008  . FREQUENCY, URINARY 08/25/2008  . CHICKENPOX, HX  OF 08/25/2008    Past Surgical History:  Procedure Laterality Date  . TYMPANOSTOMY TUBE PLACEMENT      OB History    Gravida Para Term Preterm AB Living   0 0 0 0 0 0   SAB TAB Ectopic Multiple Live Births   0 0 0 0         Home Medications    Prior to Admission medications   Medication Sig Start Date End Date Taking? Authorizing Provider  cephALEXin (KEFLEX) 500 MG capsule Take 1 capsule (500 mg total) by mouth 4 (four) times daily. 01/17/16   Roddie Mc, FNP  clindamycin (CLEOCIN T) 1 % lotion  11/23/15   Historical Provider, MD    Family History Family History  Problem Relation Age of Onset  . Hypertension Mother   . Depression Mother   . OCD Mother   . Bipolar disorder Mother   . Hypertension Father     Social History Social History  Substance Use Topics  . Smoking status: Never Smoker  . Smokeless tobacco: Never Used  . Alcohol use No     Allergies   Patient has no known allergies.   Review of Systems Review of Systems  Musculoskeletal: Positive for arthralgias (right hip) and gait problem. Negative for joint swelling.  Skin: Negative for color change and wound.  Neurological: Negative for weakness.     Physical Exam Updated Vital Signs BP 162/80 (BP Location: Left Arm)   Pulse 77   Temp 98.1  F (36.7 C) (Oral)   Resp 18   Ht 5\' 9"  (1.753 m)   Wt 260 lb (117.9 kg)   LMP 02/15/2016 (Exact Date)   SpO2 100%   BMI 38.40 kg/m   Physical Exam  Constitutional: She appears well-developed and well-nourished. No distress.  HENT:  Head: Normocephalic and atraumatic.  Eyes: EOM are normal.  Neck: Neck supple.  Cardiovascular: Normal rate.   Pulmonary/Chest: Effort normal. No respiratory distress.  Abdominal: She exhibits no distension.  Musculoskeletal:       Right hip: She exhibits tenderness (over greater trochanter). She exhibits normal range of motion (normal ROM, some increased pain with external rotation of hip), normal strength and  no bony tenderness.       Left hip: Normal.       Lumbar back: Normal.  Neurological: She is alert.  Skin: Skin is warm and dry.  Psychiatric: She has a normal mood and affect. Her behavior is normal.  Nursing note and vitals reviewed.    ED Treatments / Results  DIAGNOSTIC STUDIES: Oxygen Saturation is 100% on RA, nl by my interpretation.    COORDINATION OF CARE: 7:24 PM Discussed treatment plan with pt at bedside which includes anti-inflammatory, ice, referral and follow up with orthopedist, and pt agreed to plan.   Procedures Procedures (including critical care time)  Medications Ordered in ED Medications - No data to display   Initial Impression / Assessment and Plan / ED Course  I have reviewed the triage vital signs and the nursing notes.   Clinical Course    Patient seen and examined.   Vital signs reviewed and are as follows: BP 124/66   Pulse 82   Temp 97.5 F (36.4 C) (Oral)   Resp 17   Ht 5\' 9"  (1.753 m)   Wt 117.9 kg   LMP 02/15/2016 (Exact Date)   SpO2 100%   BMI 38.40 kg/m   Encouraged rest, NSAIDs, ice 1 week. If not improved, follow up with sports medicine referral. Patient verbalizes understanding and agrees with plan.  Final Clinical Impressions(s) / ED Diagnoses   Final diagnoses:  Right thigh pain   Patient with right eye pain in the area of her greater trochanter. She is on her feet much of the day for school. Symptoms are most consistent with a mild trochanteric bursitis. Cutaneous nerve irritation cannot be completely ruled out. No ascites of back pain and ice do not suspect lumbar etiology. Patient has good range of motion and normal gait. She is concerned about avascular necrosis of the hip, however has no risk factors for this.  New Prescriptions Discharge Medication List as of 02/16/2016  7:33 PM    START taking these medications   Details  naproxen (NAPROSYN) 500 MG tablet Take 1 tablet (500 mg total) by mouth 2 (two) times  daily., Starting Sat 02/16/2016, Print       I personally performed the services described in this documentation, which was scribed in my presence. The recorded information has been reviewed and is accurate.     Renne CriglerJoshua Deerica Waszak, PA-C 02/17/16 1434    Gwyneth SproutWhitney Plunkett, MD 02/18/16 [phone]

## 2016-02-16 NOTE — ED Triage Notes (Signed)
Pt reports recent increasing in standing at work/school and on Tuesday she felt a "pop" in her right hip.  Pt states since then, pain has been worsening each day.  Pt able to ambulate with minimal increase in discomfort.

## 2016-06-13 ENCOUNTER — Encounter (HOSPITAL_COMMUNITY): Payer: Self-pay | Admitting: *Deleted

## 2016-06-13 ENCOUNTER — Emergency Department (HOSPITAL_COMMUNITY)
Admission: EM | Admit: 2016-06-13 | Discharge: 2016-06-14 | Disposition: A | Discharge status: Home / Self Care | Payer: 59 | Attending: Emergency Medicine | Admitting: Emergency Medicine

## 2016-06-13 DIAGNOSIS — G4459 Other complicated headache syndrome: Secondary | ICD-10-CM | POA: Diagnosis not present

## 2016-06-13 DIAGNOSIS — Z79899 Other long term (current) drug therapy: Secondary | ICD-10-CM | POA: Diagnosis not present

## 2016-06-13 DIAGNOSIS — R200 Anesthesia of skin: Secondary | ICD-10-CM | POA: Diagnosis present

## 2016-06-13 LAB — COMPREHENSIVE METABOLIC PANEL
ALBUMIN: 4.3 g/dL (ref 3.5–5.0)
ALK PHOS: 57 U/L (ref 38–126)
ALT: 14 U/L (ref 14–54)
AST: 17 U/L (ref 15–41)
Anion gap: 9 (ref 5–15)
BILIRUBIN TOTAL: 0.2 mg/dL — AB (ref 0.3–1.2)
BUN: 12 mg/dL (ref 6–20)
CO2: 25 mmol/L (ref 22–32)
CREATININE: 0.69 mg/dL (ref 0.44–1.00)
Calcium: 9.6 mg/dL (ref 8.9–10.3)
Chloride: 107 mmol/L (ref 101–111)
GFR calc Af Amer: >60 mL/min (ref >60)
GFR calc non Af Amer: >60 mL/min (ref >60)
GLUCOSE: 106 mg/dL — AB (ref 65–99)
POTASSIUM: 3.4 mmol/L — AB (ref 3.5–5.1)
Sodium: 141 mmol/L (ref 135–145)
TOTAL PROTEIN: 7.2 g/dL (ref 6.5–8.1)

## 2016-06-13 LAB — CBC
HEMATOCRIT: 39.1 % (ref 36.0–46.0)
HEMOGLOBIN: 12.6 g/dL (ref 12.0–15.0)
MCH: 27 pg (ref 26.0–34.0)
MCHC: 32.2 g/dL (ref 30.0–36.0)
MCV: 83.9 fL (ref 78.0–100.0)
Platelets: 255 10*3/uL (ref 150–400)
RBC: 4.66 MIL/uL (ref 3.87–5.11)
RDW: 14 % (ref 11.5–15.5)
WBC: 8.1 10*3/uL (ref 4.0–10.5)

## 2016-06-13 LAB — MAGNESIUM: Magnesium: 1.8 mg/dL (ref 1.7–2.4)

## 2016-06-13 MED ORDER — METOCLOPRAMIDE HCL 5 MG/ML IJ SOLN
10.0000 mg | Freq: Once | INTRAMUSCULAR | Status: AC
  Administered 2016-06-13: 10 mg via INTRAMUSCULAR
  Filled 2016-06-13: qty 2

## 2016-06-13 MED ORDER — DIPHENHYDRAMINE HCL 50 MG/ML IJ SOLN
25.0000 mg | Freq: Once | INTRAMUSCULAR | Status: AC
  Administered 2016-06-13: 25 mg via INTRAMUSCULAR
  Filled 2016-06-13: qty 1

## 2016-06-13 NOTE — ED Provider Notes (Signed)
I was called to the bedside in triage to perform medical screening examination. Patient reports a numbness sensation in her left lateral chest that then spread to her arm and leg. She has associated occipital left-sided headache. She has had no weakness. No vision changes. No personal or family history of early strokes. No family history of aneurysms. No family history of ischemia or multiple sclerosis. On my assessment, patient is alert, well-appearing, in no acute distress. Neurological exam is as below. Given minimal subjective numbness on exam with no other neurological deficits and last known normal of 4 hours or greater, low suspicion for clinically significant stroke requiring intevention and do not feel code stroke is indicated at this time. Will check basic labs, send back to waiting room to await results. Given + headache, will give migraine meds while awaiting for possible complex migraine.  Neurological Exam:  Mental Status: Alert and oriented to person, place, and time. Attention and concentration normal. Speech clear. Recent memory is intact. Cranial Nerves: Visual fields grossly intact. EOMI and PERRLA. No nystagmus noted. Facial sensation intact at forehead, maxillary cheek, and chin/mandible bilaterally. No facial asymmetry or weakness. Hearing grossly normal. Uvula is midline, and palate elevates symmetrically. Normal SCM and trapezius strength. Tongue midline without fasciculations. Motor: Muscle strength 5/5 in proximal and distal UE and LE bilaterally. No pronator drift. Muscle tone normal. Reflexes: 2+ and symmetrical in all four extremities.  Sensation: Intact to light touch in upper and lower extremities distally bilaterally. Intact to light touch/pin prick but subjectively "a little different" on the left, in ulnar nerve distribution and left leg but not remainder of arm. Gait: Normal without ataxia. Coordination: Normal FTN bilaterally.      Shaune PollackIsaacs, Amita Atayde, MD 06/13/16  [phone]

## 2016-06-14 MED ORDER — POTASSIUM CHLORIDE CRYS ER 20 MEQ PO TBCR
40.0000 meq | EXTENDED_RELEASE_TABLET | Freq: Once | ORAL | Status: AC
  Administered 2016-06-14: 40 meq via ORAL
  Filled 2016-06-14: qty 2

## 2016-06-14 NOTE — ED Provider Notes (Signed)
[facility]-EMERGENCY DEPT Provider Note   CSN: [id number] Arrival date & time: 06/13/16  2048  By signing my name below, I, Caroline Washington, attest that this documentation has been prepared under the direction and in the presence of [name], PA-C. Electronically Signed: Karren CobbleNy'Kea Washington, ED Scribe. 06/14/16. 12:20 AM.   History   Chief Complaint Chief Complaint  Patient presents with  . Numbness   The history is provided by the patient. No language interpreter was used.    HPI Comments: Caroline Washington is a 26 y.o. female with no pertinent PMHx who presents to the Emergency Department complaining of gradually worsening headache with associated numbness to her left lateral chest that spread to her arm and leg PTA.Marland Kitchen. Pt states the numbness started in her left breast and after four hours it began to spread into her left arm and then her left leg. She had a hx of migraines as a child and notes it has been several years since her last migraine. No alleviating factors. She denies any other acute associated symptoms at this time.   Past Medical History:  Diagnosis Date  . Calcium oxalate renal stones    If if calcium   . History of varicella   . PCO (polycystic ovaries)    hair and irrg periods ? [place]  . Urinary frequency 11/07   no cause    Patient Active Problem List   Diagnosis Date Noted  . PCOS (polycystic ovarian syndrome) 07/31/2014  . Hx of urinary stone 07/31/2014  . Eyelid inflammation 01/14/2011  . Visit for preventive health examination 01/14/2011  . Folliculitis 10/08/2010  . Heel pain 05/20/2010  . Calcium oxalate renal stones   . Renal stone 04/18/2010  . OBESITY 08/17/2009  . POLYCYSTIC OVARIAN DISEASE 08/28/2008  . FREQUENCY, URINARY 08/25/2008  . CHICKENPOX, HX OF 08/25/2008    Past Surgical History:  Procedure Laterality Date  . TYMPANOSTOMY TUBE PLACEMENT      OB History    Gravida Para Term Preterm AB Living   0 0 0 0 0 0   SAB TAB Ectopic Multiple Live  Births   0 0 0 0         Home Medications    Prior to Admission medications   Medication Sig Start Date End Date Taking? Authorizing Provider  naproxen (NAPROSYN) 500 MG tablet Take 1 tablet (500 mg total) by mouth 2 (two) times daily. 02/16/16   Renne CriglerGeiple, Joshua, PA-C    Family History Family History  Problem Relation Age of Onset  . Hypertension Mother   . Depression Mother   . OCD Mother   . Bipolar disorder Mother   . Hypertension Father     Social History Social History  Substance Use Topics  . Smoking status: Never Smoker  . Smokeless tobacco: Never Used  . Alcohol use No     Allergies   Patient has no known allergies.   Review of Systems Review of Systems  Neurological: Positive for numbness. Negative for headaches.  All other systems reviewed and are negative.    Physical Exam Updated Vital Signs BP 133/82 (BP Location: Right Arm)   Pulse 83   Temp 98.1 F (36.7 C) (Oral)   Resp 20   Wt 258 lb (117 kg)   LMP 05/30/2016   SpO2 100%   BMI 38.10 kg/m   Physical Exam  Constitutional: She is oriented to person, place, and time. She appears well-developed and well-nourished.  HENT:  Head: Normocephalic and atraumatic.  Right Ear: External ear normal.  Left Ear: External ear normal.  Eyes: Conjunctivae and EOM are normal. Pupils are equal, round, and reactive to light.  Neck: Normal range of motion. Neck supple.  No pain with neck flexion, no meningismus  Cardiovascular: Normal rate, regular rhythm and normal heart sounds.  Exam reveals no gallop and no friction rub.   No murmur heard. Pulmonary/Chest: Effort normal and breath sounds normal. No respiratory distress. She has no wheezes. She has no rales. She exhibits no tenderness.  Abdominal: Soft. She exhibits no distension and no mass. There is no tenderness. There is no rebound and no guarding.  Musculoskeletal: Normal range of motion. She exhibits no edema or tenderness.  Normal gait.    Neurological: She is alert and oriented to person, place, and time. She has normal reflexes.  CN 3-12 intact, normal finger to nose, no pronator drift, sensation and strength intact bilaterally.  Skin: Skin is warm and dry.  Psychiatric: She has a normal mood and affect. Her behavior is normal. Judgment and thought content normal.  Nursing note and vitals reviewed.    ED Treatments / Results   DIAGNOSTIC STUDIES: Oxygen Saturation is 100% on RA, normal by my interpretation.   COORDINATION OF CARE: 12:09 AM-Discussed next steps with pt. Pt verbalized understanding and is agreeable with the plan.    Labs (all labs ordered are listed, but only abnormal results are displayed) Labs Reviewed  COMPREHENSIVE METABOLIC PANEL - Abnormal; Notable for the following:       Result Value   Potassium 3.4 (*)    Glucose, Bld 106 (*)    Total Bilirubin 0.2 (*)    All other components within normal limits  CBC  MAGNESIUM  POC URINE PREG, ED    EKG  EKG Interpretation None       Radiology No results found.  Procedures Procedures (including critical care time)  Medications Ordered in ED Medications  metoCLOPramide (REGLAN) injection 10 mg (10 mg Intramuscular Given 06/13/16 2211)  diphenhydrAMINE (BENADRYL) injection 25 mg (25 mg Intramuscular Given 06/13/16 2211)     Initial Impression / Assessment and Plan / ED Course  I have reviewed the triage vital signs and the nursing notes.  Pertinent labs & imaging results that were available during my care of the patient were reviewed by me and considered in my medical decision making (see chart for details).     Pt HA treated and symptoms resolved while in ED.  Presentation is characteristic of complex migraine HA and not concerning for [facility], ICH, Meningitis, or temporal arteritis. Pt is afebrile with no focal neuro deficits, nuchal rigidity, or change in vision. Pt is to follow up with PCP to discuss prophylactic medication. Pt  verbalizes understanding and is agreeable with plan to dc.   Potassium repleted. Final Clinical Impressions(s) / ED Diagnoses   Final diagnoses:  Other complicated headache syndrome    New Prescriptions Discharge Medication List as of 06/14/2016 12:15 AM     I personally performed the services described in this documentation, which was scribed in my presence. The recorded information has been reviewed and is accurate.       Roxy Horseman, PA-C 06/14/16 0028    Zadie Rhine, MD 06/14/16 [phone]

## 2016-06-16 ENCOUNTER — Ambulatory Visit (INDEPENDENT_AMBULATORY_CARE_PROVIDER_SITE_OTHER): Payer: 59 | Admitting: Family Medicine

## 2016-06-16 ENCOUNTER — Telehealth: Payer: Self-pay | Admitting: Internal Medicine

## 2016-06-16 ENCOUNTER — Encounter: Payer: Self-pay | Admitting: Family Medicine

## 2016-06-16 VITALS — BP 118/84 | HR 82 | Temp 98.4°F | Ht 69.0 in | Wt 254.2 lb

## 2016-06-16 DIAGNOSIS — R202 Paresthesia of skin: Secondary | ICD-10-CM

## 2016-06-16 NOTE — Patient Instructions (Signed)
Sit tight- trying to get appointments set up for you. Hopeful by end of week> I will update Dr. Fabian SharpPanosh when she returns on Wednesday and she may change plan. I would keep that appointment with her for now- may be valuable to get her perspective  I would get on the books for neurology appointment in case we need them

## 2016-06-16 NOTE — Progress Notes (Signed)
Subjective:  Caroline Washington is a 26 y.o. year old very pleasant female patient who presents for/with See problem oriented charting ROS- No facial or extremity weakness. No slurred words or trouble swallowing. no blurry vision worsening (has seen optho in last year and was told due to going back and forth from computer screen) or double vision. No confusion or word finding difficulties.     Past Medical History-  Patient Active Problem List   Diagnosis Date Noted  . PCOS (polycystic ovarian syndrome) 07/31/2014  . Hx of urinary stone 07/31/2014  . Eyelid inflammation 01/14/2011  . Visit for preventive health examination 01/14/2011  . Folliculitis 10/08/2010  . Heel pain 05/20/2010  . Calcium oxalate renal stones   . Renal stone 04/18/2010  . OBESITY 08/17/2009  . POLYCYSTIC OVARIAN DISEASE 08/28/2008  . FREQUENCY, URINARY 08/25/2008  . CHICKENPOX, HX OF 08/25/2008    Medications- reviewed and updated No current outpatient prescriptions on file.   No current facility-administered medications for this visit.     Objective: BP 118/84 (BP Location: Left Arm, Patient Position: Sitting, Cuff Size: Large)   Pulse 82   Temp 98.4 F (36.9 C) (Oral)   Ht 5\' 9"  (1.753 m)   Wt 254 lb 3.2 oz (115.3 kg)   LMP 05/30/2016   SpO2 98%   BMI 37.54 kg/m  Gen: NAD, resting comfortably CV: RRR no murmurs rubs or gallops Lungs: CTAB no crackles, wheeze, rhonchi Abdomen: soft/nontender/nondistended/normal bowel sounds. No rebound or guarding.  Monofilament testing on left side and right side of abdomen was the same- no loss of sensation. Also similar report with cold sensation Ext: no edema Skin: warm, dry Neuro: CN II-XII intact, sensation and reflexes normal throughout, 5/5 muscle strength in bilateral upper and lower extremities. Normal finger to nose. Normal rapid alternating movements. No pronator drift. Normal romberg. Normal gait.    Assessment/Plan:  Paresthesia - Plan: MR Brain W  Wo Contrast, MR CERVICAL SPINE W WO CONTRAST S: Patient was in her normal state of health 06/13/16. That morning she developed a headache. She states she had migraines as a child and used to be on medication through about age 26. She started physical therapy school in the last year and she has had a few more headaches. She described 1-2 in that time frame being disabling. She states the one on Friday morning was in left occiput area and was very mild. She states that headache was gone by noon. Around 3 PM she developed mild numbness on left upper abdomen and into left side. This seemed to slowly spread to her left arm in ulnar distributtion as well as into her upper left leg stopping at the kne. She did not have any weakness. IN the ED she was treated with reglan and benadryl and the left arm and left leg symptoms resolved. She states that she did not have complete resolution of the left upper abdomen symptoms and due to persistence presented today. She was diagnosed with complicated migraine.   She states she had an episode while in [place] for at least 36 hours where she had bilateral arm numbness weakness of unknown etiology in 2016.   A/P:Her concern is for potential multiple sclerosis (this is reasonably in the differential as is complicated migraine which was ED diagnosis). She would like to proceed with imaging which was not done in ER- I have ordered MR brain and Mr cervical spine with hopes to get these completed this week. She also wants to  see neurology potentially but was told could not be seen until around June 5th- I encouraged her to call them back and keep this visit.   Will also plan to send this to her primary care doctor who she has a follow up visit with on Wednesday for her opinion on ordered imaging.   Orders Placed This Encounter  Procedures  . MR Brain W Wo Contrast    Standing Status:   Future    Standing Expiration Date:   08/16/2017    Order Specific Question:   If indicated for  the ordered procedure, I authorize the administration of contrast media per Radiology protocol    Answer:   Yes    Order Specific Question:   Reason for Exam (SYMPTOM  OR DIAGNOSIS REQUIRED)    Answer:   numbness left upper abdomen. history weakness bilateral upper extremities in 2016. concern multiple sclerosis    Order Specific Question:   What is the patient's sedation requirement?    Answer:   No Sedation    Order Specific Question:   Does the patient have a pacemaker or implanted devices?    Answer:   No    Order Specific Question:   Preferred imaging location?    Answer:   [facility] (table limit-550lbs)    Order Specific Question:   Radiology Contrast Protocol - do NOT remove file path    Answer:   \\[email or web address]\[email or web address]  . MR CERVICAL SPINE W WO CONTRAST    Standing Status:   Future    Standing Expiration Date:   08/16/2017    Order Specific Question:   If indicated for the ordered procedure, I authorize the administration of contrast media per Radiology protocol    Answer:   Yes    Order Specific Question:   Reason for Exam (SYMPTOM  OR DIAGNOSIS REQUIRED)    Answer:   see mri brain notes. concern MS. left upper abdomen numbness- had prior left upper extremity and left upper leg numbness.    Order Specific Question:   What is the patient's sedation requirement?    Answer:   No Sedation    Order Specific Question:   Does the patient have a pacemaker or implanted devices?    Answer:   No    Order Specific Question:   Preferred imaging location?    Answer:   [facility] (table limit-550lbs)    Order Specific Question:   Radiology Contrast Protocol - do NOT remove file path    Answer:   \\[email or web address]\[email or web address]\[email or web address].PDF  new acute condition with uncertain/high risk prognosis.   Return precautions advised.  Tana Conch, MD

## 2016-06-16 NOTE — Telephone Encounter (Signed)
Pt went to ED on Friday with left side numbness.  They advised pt she may need to see a neurologist.  Pt in grad school and out of class on Wed.  Only appt Wed is the 4 pm.  Is it ok to schedule pt that time on wed?

## 2016-06-16 NOTE — Progress Notes (Signed)
Pre visit review using our clinic review tool, if applicable. No additional management support is needed unless otherwise documented below in the visit note. 

## 2016-06-16 NOTE — Telephone Encounter (Signed)
Pt's mom called back because pt concerned no ct scan or mri was done at the ED.  Pt still having the side numbness and request to be seen today. appt scheduled with Dr Durene CalHunter.

## 2016-06-16 NOTE — Progress Notes (Signed)
Chief Complaint  Patient presents with  . Follow-up    numbness    HPI: Caroline PalmaHaley A Washington 26 y.o. come in for fu eval  Plan for  Atypical numbness   Dx as atypical ha syncdrome  In ed but   Numbness persistes and not really a head ache  On going   management  Saw dr Durene CalHunter in my absence  And mri of neck and brai have been  Planned  I was out of office for  A week  Insidious onset of numbeness  Left upper abdomen sistting in recliner and then 3-4 hours later and   Anterior thight   Down to knee left   See extensive hx   Recorded  Yesterday  Regarding  Numbness in arm leg and upper abd side area   Nausea   ? From worry or not.   Had a headache  Friday am  Short lived left  Neck occipital area .  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  Past  Hx  ROS: See pertinent positives and negatives per HPI. No rash ? Pain  Last pm in the left upper side abd area  No weakness or ralling    Past Medical History:  Diagnosis Date  . Calcium oxalate renal stones    If if calcium   . History of varicella   . PCO (polycystic ovaries)    hair and irrg periods ? [place]  . Urinary frequency 11/07   no cause    Family History  Problem Relation Age of Onset  . Hypertension Mother   . Depression Mother   . OCD Mother   . Bipolar disorder Mother   . Hypertension Father     Social History   Social History  . Marital status: Single    Spouse name: N/A  . Number of children: N/A  . Years of education: N/A   Social History Main Topics  . Smoking status: Never Smoker  . Smokeless tobacco: Never Used  . Alcohol use No  . Drug use: No  . Sexual activity: Not on file   Other Topics Concern  . Not on file   Social History Narrative   Father 955'10'   Mother 5'9"   Caffeine: Coke twice weekly and coffee everyday.   HH of 3   2 cats and 3 dogs   Parents: Jasmine DecemberSharon and Rayvon CharWilliam Ronald   Sleep 7 hours      [employer]   Sports med .     Graduated recently from [employer]ines[employer]   Looking into PT school    No outpatient prescriptions prior to visit.   No facility-administered medications prior to visit.      EXAM:  BP 104/74   Temp 97.8 F (36.6 C) (Oral)   Wt 250 lb (113.4 kg)   LMP 05/30/2016   BMI 36.92 kg/m   Body mass index is 36.92 kg/m.  GENERAL: vitals reviewed and listed above, alert, oriented, appears well hydrated and in no acute distress  Somewhat  Stressed appropriately  Worried about MS as a possibilty  HEENT: atraumatic, conjunctiva  clear, no obvious abnormalities on inspection of external nose and ears  NECK: no obvious masses on inspection palpation  No mid line tenderness supple  LUNGS: clear to auscultation bilaterally, no wheezes,  rales or rhonchi, good air movement CV: HRRR, no clubbing cyanosis or  peripheral edema nl cap refill  NEURO: oriented x 3 CN 3-12 appear intact. No focal muscle weakness or atrophy but  Poss dec intrinsic muscle of left hand   . DTRs symmetrical.? Gait WNL.  Grossly non focal.  othrewise No tremor or abnormal movement. Sensation not tested but  Area  Is lateral left to ring finger  Area  And luq abd  Abdomen:  Sof,t normal bowel sounds without hepatosplenomegaly, no guarding rebound or masses no CVA tenderne MS: moves all extremities without noticeable focal  abnormality PSYCH: pleasant and cooperative,  Nl speech  And thought  Lab Results  Component Value Date   WBC 8.1 06/13/2016   HGB 12.6 06/13/2016   HCT 39.1 06/13/2016   PLT 255 06/13/2016   GLUCOSE 106 (H) 06/13/2016   CHOL 149 11/02/2014   TRIG 77.0 11/02/2014   HDL 54.10 11/02/2014   LDLCALC 79 11/02/2014   ALT 14 06/13/2016   AST 17 06/13/2016   NA 141 06/13/2016   K 3.4 (L) 06/13/2016   CL 107 06/13/2016   CREATININE 0.69 06/13/2016   BUN 12 06/13/2016   CO2 25 06/13/2016   TSH 0.823 12/09/2015   BP Readings from Last 3 Encounters:  06/18/16 104/74   06/16/16 118/84  06/14/16 130/78   Wt Readings from Last 3 Encounters:  06/18/16 250 lb (113.4 kg)  06/16/16 254 lb 3.2 oz (115.3 kg)  06/13/16 258 lb (117 kg)    ASSESSMENT AND PLAN:  Discussed the following assessment and plan:  Paresthesia - unexplained see  notes dr Durene Cal and  today 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  Not until July?   will work on earlier appt  Has mri n in 5 days   out of town clinical in [place] after June 30th   -Patient advised to return or notify health care team  if  new concerns arise.  Patient Instructions  Will work  on  Getting  Neurology to see  You earlier as we discussed .  And keep  appt for MRI .      Neta Mends. Aunica Dauphinee M.D.

## 2016-06-17 ENCOUNTER — Telehealth: Payer: Self-pay | Admitting: Internal Medicine

## 2016-06-17 NOTE — Telephone Encounter (Signed)
Is there DPR for mom? If not dont think we can communicate with mother. Could provide a copy of this message to Dr. Fabian SharpPanosh or her nurse/CMA as patient supposed to be seen 5.9.18 I believe.

## 2016-06-17 NOTE — Telephone Encounter (Signed)
Pts mother is calling to see if Dr. Durene CalHunter could get pt in sooner to see the [facility] pt was sent from the hospital with the referral unfortunately the pt can not get in until the end of July and they would like to see if Dr. Durene CalHunter can get her in sooner.  Mother has called [facility] and they could get her [place].  Pt state that she would not be contacting if they did not get the information on yesterday from Dr. Durene CalHunter.

## 2016-06-18 ENCOUNTER — Ambulatory Visit (INDEPENDENT_AMBULATORY_CARE_PROVIDER_SITE_OTHER): Payer: 59 | Admitting: Internal Medicine

## 2016-06-18 VITALS — BP 104/74 | Temp 97.8°F | Wt 250.0 lb

## 2016-06-18 DIAGNOSIS — R399 Unspecified symptoms and signs involving the genitourinary system: Secondary | ICD-10-CM

## 2016-06-18 DIAGNOSIS — R202 Paresthesia of skin: Secondary | ICD-10-CM

## 2016-06-18 NOTE — Telephone Encounter (Signed)
Pt ov today  She says has signed dpr in past for mom

## 2016-06-18 NOTE — Patient Instructions (Addendum)
Will work  on  Getting  Neurology to see  You earlier as we discussed .  And keep  appt for MRI .

## 2016-06-19 ENCOUNTER — Telehealth: Payer: Self-pay | Admitting: Internal Medicine

## 2016-06-19 ENCOUNTER — Encounter: Payer: Self-pay | Admitting: Internal Medicine

## 2016-06-19 NOTE — Telephone Encounter (Signed)
Discussed possible UTI. Only happened once (incontinence). Advised follow up on Friday with Dr. Fabian SharpPanosh. She should call early as few openings.

## 2016-06-19 NOTE — Telephone Encounter (Signed)
Mom called to speak with Dr Fabian SharpPanosh, but  Dr Fabian Sharppanosh has gone for the day. Pt saw sr hunter on Monday and wanted to know if he could possible advise pt on a new symptom. Pt has been wearing a panty liner. Pt did not think she had soiled her liner, but when she went to the bathroom, it seems "full" and has a bad odor. So this is to say pt did not even realize she had gone to the bathroom.  Pt wants to know if they need to be immediatly alarmed.  Also., still waiting on neurologist referral

## 2016-06-20 ENCOUNTER — Other Ambulatory Visit: Payer: Self-pay | Admitting: Emergency Medicine

## 2016-06-20 DIAGNOSIS — R399 Unspecified symptoms and signs involving the genitourinary system: Secondary | ICD-10-CM

## 2016-06-20 NOTE — Telephone Encounter (Signed)
she can come in for UA and urine culture since I just saw her . Today    Dx   Lower urinary symptoms LUTS

## 2016-06-20 NOTE — Telephone Encounter (Signed)
Spoke with pt and she is going to come by the office to drop off a urine specimen. Orders are in

## 2016-06-20 NOTE — Addendum Note (Signed)
Addended by: Bonnye FavaKWEI, NANA K on: 06/20/2016 01:45 PM   Modules accepted: Orders

## 2016-06-22 LAB — URINE CULTURE

## 2016-06-23 ENCOUNTER — Ambulatory Visit
Admission: RE | Admit: 2016-06-23 | Discharge: 2016-06-23 | Disposition: A | Discharge status: Home / Self Care | Payer: 59 | Source: Ambulatory Visit | Attending: Family Medicine | Admitting: Family Medicine

## 2016-06-23 DIAGNOSIS — R202 Paresthesia of skin: Secondary | ICD-10-CM

## 2016-06-23 MED ORDER — GADOBENATE DIMEGLUMINE 529 MG/ML IV SOLN
20.0000 mL | Freq: Once | INTRAVENOUS | Status: AC | PRN
  Administered 2016-06-23: 20 mL via INTRAVENOUS

## 2016-06-24 ENCOUNTER — Telehealth: Payer: Self-pay | Admitting: Internal Medicine

## 2016-06-24 DIAGNOSIS — M5020 Other cervical disc displacement, unspecified cervical region: Secondary | ICD-10-CM

## 2016-06-24 NOTE — Telephone Encounter (Signed)
Pt is calling to get clarification of the MRI and to see where she goes from here.

## 2016-06-24 NOTE — Telephone Encounter (Signed)
Spoke with Dr. Fabian SharpPanosh  We opted for referral to neurosurgery but have her keep neurology visit Opt out of PT until eval by neurosurgery Opt out of prednisone for now to get neurosurgery opinion first since symptoms are somewhat atypical  I then discussed this with patient. She is aware should be looking out for neurosurgery referral. I entered this as stat and asked if she could be seen in next week or two.

## 2016-07-04 ENCOUNTER — Encounter: Payer: Self-pay | Admitting: Family Medicine

## 2016-07-18 ENCOUNTER — Encounter: Payer: Self-pay | Admitting: Internal Medicine

## 2016-07-21 ENCOUNTER — Other Ambulatory Visit: Payer: Self-pay | Admitting: Emergency Medicine

## 2016-07-21 ENCOUNTER — Telehealth: Payer: Self-pay | Admitting: Emergency Medicine

## 2016-07-21 DIAGNOSIS — R200 Anesthesia of skin: Secondary | ICD-10-CM

## 2016-07-21 NOTE — Telephone Encounter (Signed)
Pt is returning  torrie call please call  [phone]

## 2016-07-21 NOTE — Telephone Encounter (Signed)
Patient agreed for neuro referral.

## 2016-07-21 NOTE — Telephone Encounter (Signed)
I dont think you have MS either.   Would first ask  NS for  Reasons to  FU with him ( I suspect weakness or progression.     otherwise we can also go back to getting neuro appt  Before you leave town? June 30 ? Let us know  (I  was out   Of town When this message came to me )

## 2016-07-21 NOTE — Telephone Encounter (Signed)
Left a VM for pt regarding wanting referral sent in to neurology or not?

## 2016-07-22 ENCOUNTER — Encounter: Payer: Self-pay | Admitting: Neurology

## 2016-09-22 ENCOUNTER — Telehealth: Payer: Self-pay | Admitting: Internal Medicine

## 2016-09-22 NOTE — Telephone Encounter (Signed)
Immunizations record printed and placed up front for mom to pick up. Nothing further needed at this time.

## 2016-09-22 NOTE — Telephone Encounter (Signed)
Pt mother Caroline Washington is on DPR and would like to know if her daughter has completed hep b vaccine series. Please call mom back

## 2016-09-23 NOTE — Telephone Encounter (Signed)
Mom came in and picked up form and showed ID.//[email or web address]

## 2016-09-30 ENCOUNTER — Encounter: Payer: Self-pay | Admitting: Internal Medicine

## 2016-09-30 LAB — CBC AND DIFFERENTIAL
Hemoglobin: 12.6 (ref 12.0–16.0)
Neutrophils Absolute: 62
PLATELETS: 205 (ref 150–399)
WBC: 8.3

## 2016-09-30 LAB — BASIC METABOLIC PANEL: BUN: 15 (ref 4–21)

## 2016-10-01 ENCOUNTER — Telehealth: Payer: Self-pay | Admitting: Emergency Medicine

## 2016-10-01 NOTE — Telephone Encounter (Signed)
Spoke with patient mother Caroline Washington) regarding patient going to the ER last night for pain in the pelvic region. Patient states that she was dx with having a sebaceous cyst 9 mm. Patient is being treated with clindamycin to cover infection. Patient mother states they are going out of town tomorrow and would like to know if the patient should be seen first before leaving? Please advise.

## 2016-10-01 NOTE — Telephone Encounter (Signed)
Spoke with patient mother and gave Dr. Fabian Sharp recommendation stated in the last phone note. Patient mother states that patient is doing fine as of now and will give the office a call back if patient pain increases. As of now no fever and pain under control.

## 2016-10-01 NOTE — Telephone Encounter (Signed)
Mom called because pt went to the emergency room in [place] last night. Mom not sure what it is exactly wrong.  Mom states she has info she will go over with the nurse when she returns the call.per note, this is some kind of knot on pubic area. Mom cell:  [phone] Mom on the way to pick up pt in [place].  Advised her Dr Fabian Sharp would probably need to see the discharge summary in order to advise. Mom not sure how to get that to [place].  Mom would really like Dr Fabian Sharp to call her.

## 2016-10-01 NOTE — Telephone Encounter (Signed)
If fever or getting larger increasing pain  then yes otherwise  Continue rx   Does she need to see GYNE?

## 2016-10-05 NOTE — Progress Notes (Signed)
Chief Complaint  Patient presents with  . Hospitalization Follow-up    Pt was seen in the ER in [place],Pt states she is doing fine, but still has knot on her bikini area     HPI: Caroline Washington 26 y.o.  comesin fru ed in [place] for a lump on abd ? cyst and put on antibiotic  Developed  Stomach ache  SA  Aug 21  And had a sore area   single spot.  And when  got worse aug 21 went to local ED      And had ct scan thinking it could be a hernia?  Dx was  Focal fat necrosis but put on antibiotic cause looked like infected skin apparently advise [place] of area   And poss  Surgery . Consult but had to leave.  To come home   Going  Next to [facility] for next rotation.    No fever se med .  hsa 10 days total  Better pain but still sore  To touch    No bigger.  ROS: See pertinent positives and negatives per HPI.  Past Medical History:  Diagnosis Date  . Calcium oxalate renal stones    If if calcium   . History of varicella   . PCO (polycystic ovaries)    hair and irrg periods ? [place]  . Urinary frequency 11/07   no cause    Family History  Problem Relation Age of Onset  . Hypertension Mother   . Depression Mother   . OCD Mother   . Bipolar disorder Mother   . Hypertension Father     Social History   Social History  . Marital status: Single    Spouse name: N/A  . Number of children: N/A  . Years of education: N/A   Social History Main Topics  . Smoking status: Never Smoker  . Smokeless tobacco: Never Used  . Alcohol use No  . Drug use: No  . Sexual activity: Not Asked   Other Topics Concern  . None   Social History Narrative   Father 33'10'   Mother 5'9"   Caffeine: Coke twice weekly and coffee everyday.   HH of 3   2 cats and 3 dogs   Parents: [name] and Rayvon Char   Sleep 7 hours      [employer] .     Graduated recently from [employer]ines[employer]   Looking into PT school    Outpatient Medications Prior to Visit  Medication Sig Dispense Refill  . METFORMIN HCL PO  Take 1 tablet by mouth 2 (two) times daily.     No facility-administered medications prior to visit.      EXAM:  BP 116/70 (BP Location: Right Arm, Patient Position: Sitting, Cuff Size: Normal)   Pulse 66   Temp 98.3 F (36.8 C) (Oral)   Ht 5\' 9"  (1.753 m)   Wt 251 lb (113.9 kg)   LMP 10/02/2016   SpO2 98%   BMI 37.07 kg/m   Body mass index is 37.07 kg/m.  GENERAL: vitals reviewed and listed above, alert, oriented, appears well hydrated and in no acute distress HEENT: atraumatic, conjunctiva  clear, no obvious abnormalities on inspection of external nose and ears \\abd  soft no  obv masses    At bikini line  There is a faint erythema and about 1 cm min tender  abd was  Skin lump   ? Fixed to skin no center but has  hair follicle   MS: moves all extremities without noticeable focal  abnormality PSYCH: pleasant and cooperative, no obvious depression or anxiety  ASSESSMENT AND PLAN:  Discussed the following assessment and plan:  Abdominal wall lump nodule  Hx of abdominal pain - w above  better  Acts l;ike skin related  Cyst  Pr such but no center .  Ct showed fat necrosis  And abd pain is better .  Sx hx were much more sever than exam today   But getting better.    Expectant management. And plan  Can make surgery appt  Nonurgently finish med get copy of ct report labs etc.  reassurance  Doubt  Other serious disease  -Patient advised to return or notify health care team  if symptoms worsen ,persist or new concerns arise.  Patient Instructions   Get copy of ct report  Usually fat necrosis is left alone until it improves. Is however acts  like a skin cyst that  could become inflamed Finish out the antibiotic if recurrent tenderness or getting larger get surgery consult. If it gets better and maintain she may just follow it. Sign for release of records we can get the emergency room evaluation and CT report.    [name] M.D.

## 2016-10-06 ENCOUNTER — Encounter: Payer: Self-pay | Admitting: Internal Medicine

## 2016-10-06 ENCOUNTER — Ambulatory Visit (INDEPENDENT_AMBULATORY_CARE_PROVIDER_SITE_OTHER): Payer: 59 | Admitting: Internal Medicine

## 2016-10-06 VITALS — BP 116/70 | HR 66 | Temp 98.3°F | Ht 69.0 in | Wt 251.0 lb

## 2016-10-06 DIAGNOSIS — Z87898 Personal history of other specified conditions: Secondary | ICD-10-CM

## 2016-10-06 DIAGNOSIS — R222 Localized swelling, mass and lump, trunk: Secondary | ICD-10-CM | POA: Diagnosis not present

## 2016-10-06 NOTE — Patient Instructions (Addendum)
  Get copy of ct report  Usually fat necrosis is left alone until it improves. Is however acts  like a skin cyst that  could become inflamed Finish out the antibiotic if recurrent tenderness or getting larger get surgery consult. If it gets better and maintain she may just follow it. Sign for release of records we can get the emergency room evaluation and CT report.

## 2016-10-08 ENCOUNTER — Encounter: Payer: Self-pay | Admitting: Internal Medicine

## 2016-10-24 ENCOUNTER — Encounter: Payer: Self-pay | Admitting: Neurology

## 2016-10-24 ENCOUNTER — Ambulatory Visit (INDEPENDENT_AMBULATORY_CARE_PROVIDER_SITE_OTHER): Payer: 59 | Admitting: Neurology

## 2016-10-24 VITALS — BP 114/82 | HR 74 | Ht 69.0 in | Wt 252.0 lb

## 2016-10-24 DIAGNOSIS — G5622 Lesion of ulnar nerve, left upper limb: Secondary | ICD-10-CM | POA: Diagnosis not present

## 2016-10-24 DIAGNOSIS — G44229 Chronic tension-type headache, not intractable: Secondary | ICD-10-CM

## 2016-10-24 DIAGNOSIS — R202 Paresthesia of skin: Secondary | ICD-10-CM

## 2016-10-24 NOTE — Progress Notes (Signed)
[facility] Neu[facility] Clinic Note - Initial Visit   Date: 10/24/16  Caroline Washington MRN: [id number] DOB: 10-01-1990   Dear Dr. Fabian Sharp:  Thank you for your kind referral of Caroline Washington for consultation of paresthesias. Although her history is well known to you, please allow [place] to reiterate it for the purpose of our medical record. The patient was accompanied to the clinic by mom who also provides collateral information.     History of Present Illness: Caroline Washington is a 26 y.o. right-handed Caucasian female with PCOS and anxiety presenting for evaluation of paresthesias.    Starting in May 2018, she started having numbness over the left chest/breast region, lateral hand, and an area above the knee. She went to the ER in early Mayand was given headache cocktail which alleviated her symptoms, however they returned within the following week and since then, she has constant tingling until July/August.  For the past several years, she also as intermittent hand tingling/numbnessm, worse on the left.  Because of her sporadic symptoms, she had MRI brain to evaluate for demyelinating disease, which returned normal.  MRI cervical spine showed small disc protrusion at C6-7.  She was referred here for further evaluation and management.  Because of anxiety related to her health, she was started on zoloft.  She reports having neck tension and headaches about once every 1-2 weeks. Pain is achy and throbbing.  She has some photophobia, but no nausea or vomiting.  She usually does not treat these headaches.  Usually they are ranked as 2-6/10.  Her mother had history of migraines.   Out-side paper records, electronic medical record, and images have been reviewed where available and summarized as:  MRI brain wwo contrast 06/23/2016:  Normal MRI of the brain. No findings to suggest demyelinating disease. No acute intracranial process.  MRI cervical spine wwo contrast 06/23/2016: 1. Normal MRI  appearance of the cervical spinal cord. No cord signal abnormality to suggest demyelinating disease. 2. Central/left central disc protrusion at C6-7, abutting the ventral spinal cord. Minimal cord flattening without cord signal changes. 3. Otherwise normal MRI of the cervical spine.  Lab Results  Component Value Date   TSH 0.823 12/09/2015   Lab Results  Component Value Date   ESRSEDRATE 8 08/28/2008     Past Medical History:  Diagnosis Date  . Calcium oxalate renal stones    If if calcium   . History of varicella   . PCO (polycystic ovaries)    hair and irrg periods ? [place]  . Urinary frequency 11/07   no cause    Past Surgical History:  Procedure Laterality Date  . TYMPANOSTOMY TUBE PLACEMENT       Medications:  Outpatient Encounter Prescriptions as of 10/24/2016  Medication Sig  . clindamycin (CLEOCIN) 150 MG capsule TAKE 3 CAPSULES BY MOUTH EVERY 8 HOURS FOR 10 DAYS  . METFORMIN HCL PO Take 1 tablet by mouth 2 (two) times daily.  . sertraline (ZOLOFT) 50 MG tablet Take 50 mg by mouth every morning.   No facility-administered encounter medications on file as of 10/24/2016.      Allergies: No Known Allergies  Family History: Family History  Problem Relation Age of Onset  . Hypertension Mother   . Depression Mother   . OCD Mother   . Bipolar disorder Mother   . Hypertension Father     Social History: Social History  Substance Use Topics  . Smoking status: Never Smoker  . Smokeless  tobacco: Never Used  . Alcohol use No   Social History   Social History Narrative   Father 5'10'   Mother 5'9"   Caffeine: Coke twice weekly and coffee everyday.   HH of 3   2 cats and 3 dogs   Parents: [name] and Rayvon Char   Sleep 7 hours      [employer] .     Graduated recently from Kines[employer]   Looking into PT school    Review of Systems:  CONSTITUTIONAL: No fevers, chills, night sweats, or weight loss.   EYES: No visual changes or eye pain ENT: No  hearing changes.  No history of nose bleeds.   RESPIRATORY: No cough, wheezing and shortness of breath.   CARDIOVASCULAR: Negative for chest pain, and palpitations.   GI: Negative for abdominal discomfort, blood in stools or black stools.  No recent change in bowel habits.   GU:  No history of incontinence.   MUSCLOSKELETAL: No history of joint pain or swelling.  No myalgias.   SKIN: Negative for lesions, rash, and itching.   HEMATOLOGY/ONCOLOGY: Negative for prolonged bleeding, bruising easily, and swollen nodes.  No history of cancer.   ENDOCRINE: Negative for cold or heat intolerance, polydipsia or goiter.   PSYCH:  No depression +anxiety symptoms.   NEURO: As Above.   Vital Signs:  BP 114/82   Pulse 74   Ht  (1.753 m)   Wt 252 lb (114.3 kg)   LMP 10/02/2016   SpO2 98%   BMI 37.21 kg/m    General Medical Exam:   General:  Well appearing, comfortable.   Eyes/ENT: see cranial nerve examination.   Neck: No masses appreciated.  Full range of motion without tenderness.  No carotid bruits. Respiratory:  Clear to auscultation, good air entry bilaterally.   Cardiac:  Regular rate and rhythm, no murmur.   Extremities:  No deformities, edema, or skin discoloration.  Skin:  No rashes or lesions.  Neurological Exam: MENTAL STATUS including orientation to time, place, person, recent and remote memory, attention span and concentration, language, and fund of knowledge is normal.  Speech is not dysarthric.  CRANIAL NERVES: II:  No visual field defects.  Unremarkable fundi.   III-IV-VI: Pupils equal round and reactive to light.  Normal conjugate, extra-ocular eye movements in all directions of gaze.  No nystagmus.  No ptosis.   V:  Normal facial sensation.   VII:  Normal facial symmetry and movements.   VIII:  Normal hearing and vestibular function.   IX-X:  Normal palatal movement.   XI:  Normal shoulder shrug and head rotation.   XII:  Normal tongue strength and range of motion,  no deviation or fasciculation.  MOTOR:  Motor strength is 5/5 throughout.  No atrophy, fasciculations or abnormal movements.  No pronator drift.  Tone is normal.    MSRs:  Right                                                                 Left brachioradialis 2+  brachioradialis 2+  biceps 2+  biceps 2+  triceps 2+  triceps 2+  patellar 2+  patellar 2+  ankle jerk 2+  ankle jerk 2+  Hoffman no  Hoffman no  plantar response down  plantar response down   SENSORY:  Normal and symmetric perception of light touch, pinprick, vibration, and proprioception.  Tinel's sign is positive at the left cubital tunnel only.  There is no sensory level.   COORDINATION/GAIT: Normal finger-to- nose-finger and heel-to-shin.  Intact rapid alternating movements bilaterally.  Able to rise from a chair without using arms.  Gait narrow based and stable. Tandem and stressed gait intact.    IMPRESSION: 1.  Left ulnar neuropathy at the elbow  - NCS/EMG of the left arm   - Patient education on minimizing trauma of the ulnar nerve at the elbow by limiting flexion and using soft elbow pad  - She will do her own home exercises as she is currently in PT school  2.  Left chest paresthesias, nonspecific with normal sensation on exam.  Unclear etiology.  - MRI brain and cervical spine was personally viewed with patient which does not show any worrisome findings.   - Check vitamin B12 and TSH  - I do not see that MRI thoracic spine is indicated at this time, given absence of UMN findings.  If symptoms persist or get worse, we can reconsider imaging  3.  Episodic tension headaches, stable  - She does not treat the headache as they are not severe  - If headaches become more intense, she was informed to limit all OTC headache medication to twice per week  Return to clinic in 4 months.   Thank you for allowing me to participate in patient's care.  If I can answer any additional questions, I would be pleased to do so.      Sincerely,    Caroline Bonsall K. Allena Katz, DO

## 2016-10-24 NOTE — Patient Instructions (Signed)
For your right ulnar neuropathy, please avoid over flexing at the elbow and start using a soft elbow pad.  We will check vitamin B12 and thyroid levels to be sure there is nothing else causing your tingling  NCS/EMG of the left arm  Limit all over the counter pain medications to twice per week for your headaches  Return to clinic in 4 months

## 2016-10-31 ENCOUNTER — Encounter: Payer: Self-pay | Admitting: Internal Medicine

## 2016-11-03 ENCOUNTER — Telehealth: Payer: Self-pay | Admitting: Internal Medicine

## 2016-11-03 NOTE — Telephone Encounter (Signed)
Pts mother is calling to see if pt can get a Tb blood draw on 10/2 for school in [place] and this is what is required for her grad school.  And they

## 2016-11-03 NOTE — Telephone Encounter (Signed)
Pts mother would like to have a call back to see if this can be done by another provider due to pts PCP is not in the office this week and she is needing it before 11/14/16.

## 2016-11-04 NOTE — Telephone Encounter (Signed)
pts mother is calling to check the status of the msg from yesterday and would like to have a call back before the end of today.

## 2016-11-04 NOTE — Telephone Encounter (Signed)
Patient would like to have a blood draw to test for TB for school. Dr. Fabian Sharp is currently out of the office. Would it be okay to place order? Please advise. Thank you

## 2016-11-04 NOTE — Telephone Encounter (Signed)
Ok to order [facility] lab

## 2016-11-05 ENCOUNTER — Other Ambulatory Visit: Payer: Self-pay | Admitting: Emergency Medicine

## 2016-11-05 DIAGNOSIS — Z0184 Encounter for antibody response examination: Secondary | ICD-10-CM

## 2016-11-05 NOTE — Telephone Encounter (Signed)
Left a VM for a patient to give the office a call back regarding scheduling a lab appointment. Orders have already been placed.

## 2016-11-06 NOTE — Telephone Encounter (Signed)
Attempted to contact patient but the mailbox is full. Will try back later

## 2016-11-06 NOTE — Telephone Encounter (Signed)
Pt was scheduled by Elnita Maxwell for 11/11/16

## 2016-11-11 ENCOUNTER — Other Ambulatory Visit: Payer: 59

## 2016-11-11 ENCOUNTER — Other Ambulatory Visit (INDEPENDENT_AMBULATORY_CARE_PROVIDER_SITE_OTHER): Payer: 59

## 2016-11-11 ENCOUNTER — Ambulatory Visit (INDEPENDENT_AMBULATORY_CARE_PROVIDER_SITE_OTHER): Payer: 59 | Admitting: Neurology

## 2016-11-11 ENCOUNTER — Ambulatory Visit (INDEPENDENT_AMBULATORY_CARE_PROVIDER_SITE_OTHER): Payer: 59

## 2016-11-11 DIAGNOSIS — R202 Paresthesia of skin: Secondary | ICD-10-CM

## 2016-11-11 DIAGNOSIS — Z0184 Encounter for antibody response examination: Secondary | ICD-10-CM

## 2016-11-11 DIAGNOSIS — G5622 Lesion of ulnar nerve, left upper limb: Secondary | ICD-10-CM | POA: Diagnosis not present

## 2016-11-11 DIAGNOSIS — Z23 Encounter for immunization: Secondary | ICD-10-CM | POA: Diagnosis not present

## 2016-11-11 LAB — VITAMIN B12: VITAMIN B 12: 343 pg/mL (ref 200–1100)

## 2016-11-11 LAB — TSH: TSH: 0.86 m[IU]/L

## 2016-11-11 NOTE — Procedures (Signed)
[facility]  [address], [address]  [place], [place] [address] Tel: [phone] Fax:  [phone] Test Date:  11/11/2016  Patient: Caroline Washington DOB: 1990/04/28 Physician: Nita Sickle, DO  Sex: Female Height:  Ref Phys: Nita Sickle, DO  ID#: [id number] Temp: 34.4C Technician:    Patient Complaints: This is a 26 year old female referred for evaluation of left hand paresthesias, worse over digits 4 and 5.  NCV & EMG Findings: Extensive electrodiagnostic testing of the left upper extremity shows:  1. Left median, ulnar, and mixed palmar sensory responses are within normal limits. 2. Left ulnar motor response shows decreased conduction velocity (A Elbow-B Elbow, L45, L45 m/s) at the first dorsal interosseous and abductor digit minimi muscles. Left median motor responses within normal limits. 3. There is no evidence of active or chronic motor axon loss changes affecting any of the tested muscles. Motor unit recruitment and configuration is within normal limits.  Impression: Left ulnar neuropathy with slowing across the elbow, purely demyelinating in type.   ___________________________ Nita Sickle, DO    Nerve Conduction Studies Anti Sensory Summary Table   Site NR Peak (ms) Norm Peak (ms) P-T Amp (V) Norm P-T Amp  Left Median Anti Sensory (2nd Digit)  34.4C  Wrist    2.8 <3.3 88.1 >20  Left Ulnar Anti Sensory (5th Digit)  34.4C  Wrist    2.5 <3.0 27.8 >18   Motor Summary Table   Site NR Onset (ms) Norm Onset (ms) O-P Amp (mV) Norm O-P Amp Site1 Site2 Delta-0 (ms) Dist (cm) Vel (m/s) Norm Vel (m/s)  Left Median Motor (Abd Poll Brev)  34.4C  Wrist    2.7 <3.9 10.4 >6 Elbow Wrist 5.0 28.0 56 >51  Elbow    7.7  10.3         Left Ulnar Motor (Abd Dig Minimi)  34.4C  Wrist    2.2 <3.0 10.3 >8 B Elbow Wrist 3.5 24.0 69 >51  B Elbow    5.7  10.2  A Elbow B Elbow 2.2 10.0 45 >51  A Elbow    7.9  9.8         Left Ulnar (FDI) Motor (1st DI)  34.4C  Wrist     3.5 <3.8 10.4 >8 B Elbow Wrist 3.8 25.0 66 >51  B Elbow    7.3  9.4  A Elbow B Elbow 2.2 10.0 45 >51  A Elbow    9.5  8.0          Comparison Summary Table   Site NR Peak (ms) Norm Peak (ms) P-T Amp (V) Site1 Site2 Delta-P (ms) Norm Delta (ms)  Left Median/Ulnar Palm Comparison (Wrist - 8cm)  34.4C  Median Palm    1.6 <2.2 95.0 Median Palm Ulnar Palm 0.2   Ulnar Palm    1.4 <2.2 28.6       EMG   Side Muscle Ins Act Fibs Psw Fasc Number Recrt Dur Dur. Amp Amp. Poly Poly. Comment  Left 1stDorInt Nml Nml Nml Nml Nml Nml Nml Nml Nml Nml Nml Nml N/A  Left ABD Dig Min Nml Nml Nml Nml Nml Nml Nml Nml Nml Nml Nml Nml N/A  Left Ext Indicis Nml Nml Nml Nml Nml Nml Nml Nml Nml Nml Nml Nml N/A  Left FlexCarpiUln Nml Nml Nml Nml Nml Nml Nml Nml Nml Nml Nml Nml N/A  Left PronatorTeres Nml Nml Nml Nml Nml Nml Nml Nml Nml Nml Nml Nml N/A  Left  Biceps Nml Nml Nml Nml Nml Nml Nml Nml Nml Nml Nml Nml N/A  Left Triceps Nml Nml Nml Nml Nml Nml Nml Nml Nml Nml Nml Nml N/A  Left Deltoid Nml Nml Nml Nml Nml Nml Nml Nml Nml Nml Nml Nml N/A      Waveforms:

## 2016-11-12 ENCOUNTER — Encounter: Payer: Self-pay | Admitting: Neurology

## 2016-11-14 LAB — QUANTIFERON TB GOLD ASSAY (BLOOD)
Mitogen-Nil: >10 IU/mL
QUANTIFERON NIL VALUE: 0.05 [IU]/mL
QUANTIFERON(R)-TB GOLD: NEGATIVE
Quantiferon Tb Ag Minus Nil Value: <0 IU/mL

## 2016-12-15 ENCOUNTER — Telehealth: Payer: Self-pay | Admitting: Internal Medicine

## 2016-12-15 DIAGNOSIS — G5620 Lesion of ulnar nerve, unspecified upper limb: Secondary | ICD-10-CM

## 2016-12-15 NOTE — Telephone Encounter (Signed)
PT mom would like a referral to [facility] for [facility]TR/l,cl[facility] . Phone [phone] fax # [phone] . [address] [phone] Pt has an appt 12-18-16. Pt will be seen for weakness and numbness from elbow down. Pt saw dr patel and had nerve conduct study and does have nerve damage per mom. Pt has uhc

## 2016-12-16 NOTE — Telephone Encounter (Signed)
Mom would like to make sure Dr Fabian Sharppanosh has put referral in for pt to be seen here. Mom would like a call back to confirm

## 2016-12-16 NOTE — Telephone Encounter (Signed)
Spoke with pt Mother Caroline Washington -- pt is scheduled for 12/18/16 with Dr Oletta CohnMaureen Dupreez at [facility] and for insurance purposes needs a referral placed by PCP - for eval of weakness/numbness in elbow/arm. Please advise Dr Fabian SharpPanosh, thanks.

## 2016-12-16 NOTE — Telephone Encounter (Signed)
Referral placed.  Called Caroline Washington and made aware that referral placed.  Encounter to be closed, pt mom advised to call back if there is an issue with the appt from this point forward.  Nothing further needed.

## 2016-12-16 NOTE — Telephone Encounter (Signed)
Ok to refer   For ulnar neuropathy

## 2016-12-18 ENCOUNTER — Ambulatory Visit: Payer: 59 | Attending: Internal Medicine | Admitting: Occupational Therapy

## 2016-12-18 DIAGNOSIS — R208 Other disturbances of skin sensation: Secondary | ICD-10-CM | POA: Insufficient documentation

## 2016-12-18 DIAGNOSIS — M6281 Muscle weakness (generalized): Secondary | ICD-10-CM | POA: Diagnosis present

## 2016-12-18 DIAGNOSIS — G5622 Lesion of ulnar nerve, left upper limb: Secondary | ICD-10-CM | POA: Diagnosis present

## 2016-12-18 NOTE — Patient Instructions (Signed)
Hand out provided on modifying act and what to avoid  Elbow padded sleeve made and wear during day and night time  Contrast can be done  2 x day  Ulnar N glides - 3 reps  2 x day

## 2016-12-18 NOTE — Therapy (Signed)
[facility] [address], [place], [address] Phone: [phone]   Fax:  [phone]  Occupational Therapy Evaluation  Patient Details  Name: Caroline Washington MRN: [id number] Date of Birth: 02-02-1991 Referring Provider: Fabian Sharp   Encounter Date: 12/18/2016  OT End of Session - 12/18/16 1428    Visit Number  1    Number of Visits  10    Date for OT Re-Evaluation  01/22/17    OT Start Time  1331    OT Stop Time  1428    OT Time Calculation (min)  57 min    Activity Tolerance  Patient tolerated treatment well    Behavior During Therapy  [facility] for tasks assessed/performed       Past Medical History:  Diagnosis Date  . Calcium oxalate renal stones    If if calcium   . History of varicella   . PCO (polycystic ovaries)    hair and irrg periods ? [place]  . Urinary frequency 11/07   no cause    Past Surgical History:  Procedure Laterality Date  . TYMPANOSTOMY TUBE PLACEMENT      There were no vitals filed for this visit.  Subjective Assessment - 12/18/16 1413    Subjective   I had numbness in my side and chest - and arm around May - but that is getting better - Vit B but arm from elbow down still  numb and nerve conduction showed compression  with Nerve conduction     Patient Stated Goals  Want the numbness better - workout out again    Currently in Pain?  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 -  likes to do yoga, work out in gym, read       Strength   Right Hand Grip (lbs)  79    Right Hand Lateral Pinch  18 lbs    Right Hand 3 Point Pinch  16 lbs    Left Hand Grip (lbs)  68    Left Hand Lateral Pinch  18 lbs    Left Hand 3 Point Pinch  15 lbs         Contrast done - Ulnar N glide review again - better after contrast - less discomfort and more range    Hand out provided on modifying act and what to avoid  Elbow padded sleeve made and wear during day and night time  Contrast can be done  2 x day  Ulnar N glides - 3 reps  2 x day  ionto done  With dexamethazone patch - med patch over L cubital tunnel - 19 min and 2.0 current - skin check done prior and end - not issues               OT Education - 12/18/16 1427    Education provided  Yes    Education Details  Findings of eval  and HEP     Person(s) Educated  Patient    Methods  Explanation;Demonstration;Tactile cues;Verbal cues;Handout  Comprehension  Verbalized understanding;Need further instruction;Returned demonstration;Verbal cues required       OT Short Term Goals - 12/18/16 1432      OT SHORT TERM GOAL #1   Title  Numbness decrease in L UE to about 3-5 x day with activities and not more than 4/10     Baseline  at end of day numbness increase to 8/10 - and constant at moment distal from elbow to 5th     Time  3    Period  Weeks    Status  New    Target Date  01/08/17      OT SHORT TERM GOAL #2   Title  Pt to be independent in HEP  including using elbow pad correctly , to decrease numbness , increase flexibiltiy with nerve glide     Baseline  some knowledge on positioning     Time  3    Period  Weeks    Status  New    Target Date  01/08/17        OT Long Term Goals - 12/18/16 1434      OT LONG TERM GOAL #1   Title  Pt numbness in L UE decrease on PRWHE scale with more than 20 points     Baseline  at eval numbness score for 32/50     Time  5    Period  Weeks    Status  New    Target Date  01/22/17      OT LONG TERM GOAL #2   Title  Pt L UE symptoms and function return to prior level of function     Baseline  Favor arm during sleeping ,in class sitting , driving , working with pt on clinical , working on computer - cannot do yoga or   workout in gym     Time  5    Period  Weeks    Status  New    Target Date  01/22/17            Plan - 12/18/16 1428    Clinical Impression Statement  Pt present at OT evaluation with L Cubital tunnel compression of Ulnar N since May - nerve conduction test was positive for compression - pt show positive Tinel , but negative Froments -  decrease grip but prehension WNL -  numbness  distal from elbow to 5th - at end of day worse -  sometimes end of day 4th digits involve -  numbness usually at 2/10 but end of day increase to 8/10     Occupational performance deficits (Please refer to evaluation for details):  ADL's;Rest and Sleep;IADL's;Play;Work;Leisure    Rehab Potential  Good    OT Frequency  2x / week    OT Duration  -- 5 wks    OT Treatment/Interventions  Self-care/ADL training;Patient/family education;Therapeutic exercises;Contrast Bath;Iontophoresis;Manual Therapy    Plan  assess progress and ionto to be done     OT Home Exercise Plan  see pt instruction     Consulted and Agree with Plan of Care  Patient       Patient will benefit from skilled therapeutic intervention in order to improve the following deficits and impairments:  Impaired flexibility, Impaired UE functional use, Decreased strength, Decreased knowledge of precautions, Impaired sensation  Visit Diagnosis: Other disturbances of skin sensation - Plan: Ot plan of care cert/re-cert  Cubital tunnel syndrome on left - Plan: Ot plan of care cert/re-cert  Muscle weakness (generalized) -  Plan: Ot plan of care cert/re-cert    Problem List Patient Active Problem List   Diagnosis Date Noted  . PCOS (polycystic ovarian syndrome) 07/31/2014  . Hx of urinary stone 07/31/2014  . Eyelid inflammation 01/14/2011  . Visit for preventive health examination 01/14/2011  . Folliculitis 10/08/2010  . Heel pain 05/20/2010  . Calcium oxalate renal stones   . Renal stone 04/18/2010  . OBESITY 08/17/2009  . POLYCYSTIC OVARIAN  DISEASE 08/28/2008  . FREQUENCY, URINARY 08/25/2008  . CHICKENPOX, HX OF 08/25/2008    [name], Jarmal Lewelling OTR/L,[name] 12/18/2016, 2:40 PM  [facility] [address] [place], [place], [id number] Phone: [phone]   Fax:  [phone]  Name: Caroline Washington MRN: [id number] Date of Birth: [birth year]

## 2016-12-22 ENCOUNTER — Telehealth: Payer: Self-pay | Admitting: Orthopedic Surgery

## 2016-12-22 NOTE — Telephone Encounter (Signed)
I called and tried to schedule this patient.  She said she unaware that she had been referred to an orthopedist.  She will check with [place] and call us back.

## 2016-12-24 ENCOUNTER — Ambulatory Visit: Payer: 59 | Admitting: Occupational Therapy

## 2016-12-24 DIAGNOSIS — R208 Other disturbances of skin sensation: Principal | ICD-10-CM

## 2016-12-24 DIAGNOSIS — M6281 Muscle weakness (generalized): Secondary | ICD-10-CM

## 2016-12-24 DIAGNOSIS — G5622 Lesion of ulnar nerve, left upper limb: Secondary | ICD-10-CM

## 2016-12-24 NOTE — Therapy (Signed)
[place] [facility] [address] [place], [place], [address] Phone: [phone]   Fax:  [phone]  Occupational Therapy Treatment  Patient Details  Name: Caroline Washington MRN: [id number] Date of Birth: 24-Dec-1990 Referring Provider: Fabian SharpPanosh   Encounter Date: 12/24/2016  OT End of Session - 12/24/16 1505    Visit Number  2    Number of Visits  10    Date for OT Re-Evaluation  01/22/17    OT Start Time  1111    OT Stop Time  1148    OT Time Calculation (min)  37 min    Activity Tolerance  Patient tolerated treatment well    Behavior During Therapy  [facility] for tasks assessed/performed       Past Medical History:  Diagnosis Date  . Calcium oxalate renal stones    If if calcium   . History of varicella   . PCO (polycystic ovaries)    hair and irrg periods ? us  . Urinary frequency 11/07   no cause    Past Surgical History:  Procedure Laterality Date  . TYMPANOSTOMY TUBE PLACEMENT      There were no vitals filed for this visit.  Subjective Assessment - 12/24/16 1350    Subjective   It felt better when I wear the elbow sleeve-  during class and sleeping - driving still bothers me and I can feel if I take the sleeve off - but with it on - it feels better     Patient Stated Goals  Want the numbness better - workout out again    Currently in Pain?  No/denies        soft tissue mobs done with Graston tools sweeping tool nr 2 on volar and dorsal forearm  And volar prior to ionto   Ulnar N glide to cont with  And replace padding in elbow sleeve and review wearing and position of L arm during driving , sleeping and sitting in class         skin check done prior and afterwards - pt to wear medicine pad for about hour afterwards   OT Treatments/Exercises (OP) - 12/24/16 0001      Iontophoresis   Type of Iontophoresis  Dexamethasone    Location  L Cubital tunnel    Dose  med patch , 2.0 current     Time  19  min              OT Education - 12/24/16 1352    Education provided  Yes    Education Details  progress and HEP inclding wearing of elbow sleeve     Person(s) Educated  Patient    Methods  Explanation;Demonstration;Tactile cues    Comprehension  Verbalized understanding;Returned demonstration       OT Short Term Goals - 12/18/16 1432      OT SHORT TERM GOAL #1   Title  Numbness decrease in L UE to about 3-5 x day with activities and not more than 4/10     Baseline  at end of day numbness increase to 8/10 - and constant at moment distal from elbow to 5th     Time  3    Period  Weeks    Status  New    Target Date  01/08/17      OT SHORT TERM GOAL #2   Title  Pt to be independent in HEP  including using elbow pad correctly ,  to decrease numbness , increase flexibiltiy with nerve glide     Baseline  some knowledge on positioning     Time  3    Period  Weeks    Status  New    Target Date  01/08/17        OT Long Term Goals - 12/18/16 1434      OT LONG TERM GOAL #1   Title  Pt numbness in L UE decrease on PRWHE scale with more than 20 points     Baseline  at eval numbness score for 32/50     Time  5    Period  Weeks    Status  New    Target Date  01/22/17      OT LONG TERM GOAL #2   Title  Pt L UE symptoms and function return to prior level of function     Baseline  Favor arm during sleeping ,in class sitting , driving , working with pt on clinical , working on computer - cannot do yoga or  workout in gym     Time  5    Period  Weeks    Status  New    Target Date  01/22/17            Plan - 12/24/16 1506    Clinical Impression Statement  Pt report her Cubital tunnel symptoms feels better with elbow compression sleeve - except when driving in car - pt to turn sleeve with padding to inside of elbow- still positive Tinel and  tender - had 2nd session of ionto this date     Occupational performance deficits (Please refer to evaluation for details):   ADL's;IADL's;Work;Play;Leisure    Rehab Potential  Good    OT Frequency  2x / week    OT Duration  4 weeks    OT Treatment/Interventions  Self-care/ADL training;Patient/family education;Therapeutic exercises;Contrast Bath;Iontophoresis;Manual Therapy    Plan  ionto with dexamethazone     Clinical Decision Making  Limited treatment options, no task modification necessary    OT Home Exercise Plan  see pt instruction     Consulted and Agree with Plan of Care  Patient       Patient will benefit from skilled therapeutic intervention in order to improve the following deficits and impairments:  Impaired flexibility, Impaired UE functional use, Decreased strength, Decreased knowledge of precautions, Impaired sensation  Visit Diagnosis: Other disturbances of skin sensation  Cubital tunnel syndrome on left  Muscle weakness (generalized)    Problem List Patient Active Problem List   Diagnosis Date Noted  . PCOS (polycystic ovarian syndrome) 07/31/2014  . Hx of urinary stone 07/31/2014  . Eyelid inflammation 01/14/2011  . Visit for preventive health examination 01/14/2011  . Folliculitis 10/08/2010  . Heel pain 05/20/2010  . Calcium oxalate renal stones   . Renal stone 04/18/2010  . OBESITY 08/17/2009  . POLYCYSTIC OVARIAN DISEASE 08/28/2008  . FREQUENCY, URINARY 08/25/2008  . CHICKENPOX, HX OF 08/25/2008    [name], Jaquel Coomer OTR/L,[name] 12/24/2016, 3:23 PM  [place] [facility] [address] [place], [place], [address] Phone: [phone]   Fax:  [phone]  Name: Caroline Washington MRN: [id number] Date of Birth: [birth year]

## 2016-12-26 ENCOUNTER — Ambulatory Visit: Payer: 59 | Admitting: Occupational Therapy

## 2016-12-26 ENCOUNTER — Ambulatory Visit (INDEPENDENT_AMBULATORY_CARE_PROVIDER_SITE_OTHER): Payer: 59 | Admitting: Adult Health

## 2016-12-26 ENCOUNTER — Encounter: Payer: Self-pay | Admitting: Adult Health

## 2016-12-26 VITALS — BP 112/88 | Temp 98.2°F | Wt 258.0 lb

## 2016-12-26 DIAGNOSIS — R059 Cough, unspecified: Secondary | ICD-10-CM

## 2016-12-26 DIAGNOSIS — G5622 Lesion of ulnar nerve, left upper limb: Secondary | ICD-10-CM

## 2016-12-26 DIAGNOSIS — R05 Cough: Secondary | ICD-10-CM | POA: Diagnosis not present

## 2016-12-26 DIAGNOSIS — M6281 Muscle weakness (generalized): Secondary | ICD-10-CM

## 2016-12-26 DIAGNOSIS — R208 Other disturbances of skin sensation: Secondary | ICD-10-CM | POA: Diagnosis not present

## 2016-12-26 MED ORDER — BENZONATATE 200 MG PO CAPS: 200.0000 mg | ORAL_CAPSULE | Freq: Two times a day (BID) | ORAL | 0 refills | Status: DC | PRN

## 2016-12-26 MED ORDER — HYDROCODONE-HOMATROPINE 5-1.5 MG/5ML PO SYRP: 5.0000 mL | ORAL_SOLUTION | Freq: Three times a day (TID) | ORAL | 0 refills | Status: DC | PRN

## 2016-12-26 NOTE — Therapy (Signed)
[place] [facility] [address] [place], [place], [address] Phone: [phone]   Fax:  [phone]  Occupational Therapy Treatment  Patient Details  Name: Caroline PalmaHaley A Solly MRN: [id number] Date of Birth: 08-14-1990 Referring Provider: Fabian SharpPanosh   Encounter Date: 12/26/2016  OT End of Session - 12/26/16 1403    Visit Number  3    Number of Visits  10    Date for OT Re-Evaluation  01/22/17    OT Start Time  1320    OT Stop Time  1350    OT Time Calculation (min)  30 min    Activity Tolerance  Patient tolerated treatment well    Behavior During Therapy  [facility] for tasks assessed/performed       Past Medical History:  Diagnosis Date  . Calcium oxalate renal stones    If if calcium   . History of varicella   . PCO (polycystic ovaries)    hair and irrg periods ? us  . Urinary frequency 11/07   no cause    Past Surgical History:  Procedure Laterality Date  . TYMPANOSTOMY TUBE PLACEMENT      There were no vitals filed for this visit.  Subjective Assessment - 12/26/16 1402    Subjective   Doing okay with the elbow pad - have less symptoms - it is not constant anymore- maybe need more padding next time     Patient Stated Goals  Want the numbness better - workout out again    Currently in Pain?  No/denies           Tender over cubital tunnel - and Positive Tinel   Ulnar N glide to cont with  And  Will replace padding in elbow sleeve again next time and review wearing and position of L arm during driving , sleeping and sitting in class  skin check done prior - pt to wear medicine pad for about hour afterwards           OT Treatments/Exercises (OP) - 12/26/16 0001      Iontophoresis   Type of Iontophoresis  Dexamethasone    Location  L Cubital tunnel    Dose  med patch , 2.0 current     Time  19 min              OT Education - 12/26/16 1402    Education provided  Yes    Education Details   Positiong of L UE     Person(s) Educated  Patient    Methods  Explanation;Demonstration;Tactile cues    Comprehension  Verbalized understanding       OT Short Term Goals - 12/18/16 1432      OT SHORT TERM GOAL #1   Title  Numbness decrease in L UE to about 3-5 x day with activities and not more than 4/10     Baseline  at end of day numbness increase to 8/10 - and constant at moment distal from elbow to 5th     Time  3    Period  Weeks    Status  New    Target Date  01/08/17      OT SHORT TERM GOAL #2   Title  Pt to be independent in HEP  including using elbow pad correctly , to decrease numbness , increase flexibiltiy with nerve glide     Baseline  some knowledge on positioning     Time  3  Period  Weeks    Status  New    Target Date  01/08/17        OT Long Term Goals - 12/18/16 1434      OT LONG TERM GOAL #1   Title  Pt numbness in L UE decrease on PRWHE scale with more than 20 points     Baseline  at eval numbness score for 32/50     Time  5    Period  Weeks    Status  New    Target Date  01/22/17      OT LONG TERM GOAL #2   Title  Pt L UE symptoms and function return to prior level of function     Baseline  Favor arm during sleeping ,in class sitting , driving , working with pt on clinical , working on computer - cannot do yoga or  workout in gym     Time  5    Period  Weeks    Status  New    Target Date  01/22/17            Plan - 12/26/16 1403    Clinical Impression Statement  Report symptoms not constant anymore - and elbow pad - had 3rd session of ionto this date     Occupational performance deficits (Please refer to evaluation for details):  ADL's;IADL's;Work;Play;Leisure    Rehab Potential  Good    OT Frequency  2x / week    OT Duration  4 weeks    OT Treatment/Interventions  Self-care/ADL training;Patient/family education;Therapeutic exercises;Contrast Bath;Iontophoresis;Manual Therapy    Plan  ionto with dexamethazone     Clinical Decision  Making  Limited treatment options, no task modification necessary    OT Home Exercise Plan  see pt instruction     Consulted and Agree with Plan of Care  Patient       Patient will benefit from skilled therapeutic intervention in order to improve the following deficits and impairments:  Impaired flexibility, Impaired UE functional use, Decreased strength, Decreased knowledge of precautions, Impaired sensation  Visit Diagnosis: Other disturbances of skin sensation  Cubital tunnel syndrome on left  Muscle weakness (generalized)    Problem List Patient Active Problem List   Diagnosis Date Noted  . PCOS (polycystic ovarian syndrome) 07/31/2014  . Hx of urinary stone 07/31/2014  . Eyelid inflammation 01/14/2011  . Visit for preventive health examination 01/14/2011  . Folliculitis 10/08/2010  . Heel pain 05/20/2010  . Calcium oxalate renal stones   . Renal stone 04/18/2010  . OBESITY 08/17/2009  . POLYCYSTIC OVARIAN DISEASE 08/28/2008  . FREQUENCY, URINARY 08/25/2008  . CHICKENPOX, HX OF 08/25/2008    [name], Vinayak Bobier OTR/L,[name] 12/26/2016, 2:05 PM  [place] [facility] [address] [place], [place], [address] Phone: [phone]   Fax:  [phone]  Name: Caroline PalmaHaley A Carder MRN: [id number] Date of Birth: 06/30/1990

## 2016-12-26 NOTE — Patient Instructions (Signed)
Same

## 2016-12-26 NOTE — Progress Notes (Signed)
Subjective:    Patient ID: Caroline Washington, female    DOB: 03/07/1990, 26 y.o.   MRN: [id number]  Cough  This is a new problem. The current episode started in the past 7 days. The problem occurs constantly. The cough is non-productive. Pertinent negatives include no fever, headaches, nasal congestion, postnasal drip, rhinorrhea, shortness of breath or wheezing. Nothing aggravates the symptoms. She has tried OTC cough suppressant for the symptoms. The treatment provided mild relief. There is no history of asthma, bronchitis, emphysema or pneumonia.      Review of Systems  Constitutional: Negative for fever.  HENT: Negative for postnasal drip, rhinorrhea, sinus pressure and sinus pain.   Respiratory: Positive for cough. Negative for chest tightness, shortness of breath and wheezing.   Neurological: Negative for headaches.   Past Medical History:  Diagnosis Date  . Calcium oxalate renal stones    If if calcium   . History of varicella   . PCO (polycystic ovaries)    hair and irrg periods ? us  . Urinary frequency 11/07   no cause    Social History   Socioeconomic History  . Marital status: Single    Spouse name: Not on file  . Number of children: Not on file  . Years of education: Not on file  . Highest education level: Not on file  Social Needs  . Financial resource strain: Not on file  . Food insecurity - worry: Not on file  . Food insecurity - inability: Not on file  . Transportation needs - medical: Not on file  . Transportation needs - non-medical: Not on file  Occupational History  . Not on file  Tobacco Use  . Smoking status: Never Smoker  . Smokeless tobacco: Never Used  Substance and Sexual Activity  . Alcohol use: No  . Drug use: No  . Sexual activity: Not on file  Other Topics Concern  . Not on file  Social History Narrative   Father 65'10'   Mother 5'9"   Caffeine: Coke twice weekly and coffee everyday.   HH of 3   2 cats and 3 dogs   Parents: Jasmine DecemberSharon  and Rayvon CharWilliam Ronald   Sleep 7 hours      [employer]   Sports med .     Graduated recently from [employer]   Looking into PT school    Past Surgical History:  Procedure Laterality Date  . TYMPANOSTOMY TUBE PLACEMENT      Family History  Problem Relation Age of Onset  . Hypertension Mother   . Depression Mother   . OCD Mother   . Bipolar disorder Mother   . Hypertension Father     No Known Allergies  Current Outpatient Medications on File Prior to Visit  Medication Sig Dispense Refill  . sertraline (ZOLOFT) 100 MG tablet Take 1 tablet daily by mouth.    . vitamin B-12 (CYANOCOBALAMIN) 1000 MCG tablet Take 1,000 mcg daily by mouth.     No current facility-administered medications on file prior to visit.     BP 112/88 (BP Location: Left Arm)   Temp 98.2 F (36.8 C) (Oral)   Wt 258 lb (117 kg)   BMI 38.10 kg/m       Objective:   Physical Exam  Constitutional: She is oriented to person, place, and time. She appears well-developed and well-nourished. No distress.  Cardiovascular: Normal rate, regular rhythm, normal heart sounds and intact distal pulses. Exam reveals no gallop and no friction rub.  No murmur heard. Pulmonary/Chest: Effort normal and breath sounds normal. No respiratory distress. She has no wheezes. She has no rales. She exhibits no tenderness.  Neurological: She is alert and oriented to person, place, and time.  Skin: Skin is warm and dry. No rash noted. She is not diaphoretic. No erythema.  Psychiatric: She has a normal mood and affect. Her behavior is normal. Judgment and thought content normal.  Nursing note and vitals reviewed.     Assessment & Plan:  1. Cough - Viral cough. Will treat with tessalon pearls and hycodan. Follow up as needed - HYDROcodone-homatropine (HYCODAN) 5-1.5 MG/5ML syrup; Take 5 mLs every 8 (eight) hours as needed by mouth for cough.  Dispense: 120 mL; Refill: 0 - benzonatate (TESSALON) 200 MG capsule; Take 1 capsule (200 mg total)  2 (two) times daily as needed by mouth for cough.  Dispense: 20 capsule; Refill: 0  Shirline Freesory Karin Griffith, NP

## 2016-12-30 ENCOUNTER — Ambulatory Visit: Payer: 59 | Admitting: Occupational Therapy

## 2016-12-30 DIAGNOSIS — M6281 Muscle weakness (generalized): Secondary | ICD-10-CM

## 2016-12-30 DIAGNOSIS — R208 Other disturbances of skin sensation: Principal | ICD-10-CM

## 2016-12-30 DIAGNOSIS — G5622 Lesion of ulnar nerve, left upper limb: Secondary | ICD-10-CM

## 2016-12-30 NOTE — Therapy (Signed)
[place] [facility] [address] [place], [place], [address] Phone: [phone]   Fax:  [phone]  Occupational Therapy Treatment  Patient Details  Name: Caroline Washington MRN: [id number] Date of Birth: 03-Aug-1990 Referring Provider: Fabian SharpPanosh   Encounter Date: 12/30/2016  OT End of Session - 12/30/16 2027    Visit Number  4    Number of Visits  10    Date for OT Re-Evaluation  01/22/17    OT Start Time  1135    OT Stop Time  1211    OT Time Calculation (min)  36 min    Activity Tolerance  Patient tolerated treatment well    Behavior During Therapy  [facility] tasks assessed/performed       Past Medical History:  Diagnosis Date  . Calcium oxalate renal stones    If if calcium   . History of varicella   . PCO (polycystic ovaries)    hair and irrg periods ? us  . Urinary frequency 11/07   no cause    Past Surgical History:  Procedure Laterality Date  . TYMPANOSTOMY TUBE PLACEMENT      There were no vitals filed for this visit.  Subjective Assessment - 12/30/16 2026    Subjective   I can tell difference - not as much numbness during the day like I use to - the elbow pad helps - but need new one - it is stretch out     Patient Stated Goals  Want the numbness better - workout out again    Currently in Pain?  No/denies       Still tender in cubital tunnel but less numbness during day             OT Treatments/Exercises (OP) - 12/30/16 0001      Iontophoresis   Type of Iontophoresis  Dexamethasone    Location  L Cubital tunnel    Dose  med patch , 2.0 current     Time  19 min      Made new elbow pad - and padding - other one stretch out and padding not working  Review again use of it and preventing it from rolling at upper arm         OT Education - 12/30/16 2027    Education provided  Yes    Education Details  progress - and use of elbow pad and ionto had 4 sessions     Person(s) Educated   Patient    Methods  Explanation;Demonstration;Tactile cues    Comprehension  Verbalized understanding       OT Short Term Goals - 12/18/16 1432      OT SHORT TERM GOAL #1   Title  Numbness decrease in L UE to about 3-5 x day with activities and not more than 4/10     Baseline  at end of day numbness increase to 8/10 - and constant at moment distal from elbow to 5th     Time  3    Period  Weeks    Status  New    Target Date  01/08/17      OT SHORT TERM GOAL #2   Title  Pt to be independent in HEP  including using elbow pad correctly , to decrease numbness , increase flexibiltiy with nerve glide     Baseline  some knowledge on positioning     Time  3    Period  Weeks  Status  New    Target Date  01/08/17        OT Long Term Goals - 12/18/16 1434      OT LONG TERM GOAL #1   Title  Pt numbness in L UE decrease on PRWHE scale with more than 20 points     Baseline  at eval numbness score for 32/50     Time  5    Period  Weeks    Status  New    Target Date  01/22/17      OT LONG TERM GOAL #2   Title  Pt L UE symptoms and function return to prior level of function     Baseline  Favor arm during sleeping ,in class sitting , driving , working with pt on clinical , working on computer - cannot do yoga or  workout in gym     Time  5    Period  Weeks    Status  New    Target Date  01/22/17            Plan - 12/30/16 2028    Clinical Impression Statement  Pt show decrease cubital tunnel symptoms - elbow pad needed new one this date - to cont with ionto and use of elbow pad and Ulnar N glides     Occupational performance deficits (Please refer to evaluation for details):  ADL's;IADL's;Work;Play;Leisure    Rehab Potential  Good    OT Frequency  2x / week    OT Duration  4 weeks    OT Treatment/Interventions  Self-care/ADL training;Patient/family education;Therapeutic exercises;Contrast Bath;Iontophoresis;Manual Therapy    Plan  5 session of ionto    Clinical Decision  Making  Limited treatment options, no task modification necessary    OT Home Exercise Plan  see pt instruction     Consulted and Agree with Plan of Care  Patient       Patient will benefit from skilled therapeutic intervention in order to improve the following deficits and impairments:  Impaired flexibility, Impaired UE functional use, Decreased strength, Decreased knowledge of precautions, Impaired sensation  Visit Diagnosis: Other disturbances of skin sensation  Cubital tunnel syndrome on left  Muscle weakness (generalized)    Problem List Patient Active Problem List   Diagnosis Date Noted  . PCOS (polycystic ovarian syndrome) 07/31/2014  . Hx of urinary stone 07/31/2014  . Eyelid inflammation 01/14/2011  . Visit for preventive health examination 01/14/2011  . Folliculitis 10/08/2010  . Heel pain 05/20/2010  . Calcium oxalate renal stones   . Renal stone 04/18/2010  . OBESITY 08/17/2009  . POLYCYSTIC OVARIAN DISEASE 08/28/2008  . FREQUENCY, URINARY 08/25/2008  . CHICKENPOX, HX OF 08/25/2008    [name], Dejanira Pamintuan OTR/L,[name] 12/30/2016, 8:30 PM  [place] [facility] [address] [place], [place], [address] Phone: [phone]   Fax:  [phone]  Name: Caroline Washington MRN: [id number] Date of Birth: 03-22-1990

## 2016-12-30 NOTE — Patient Instructions (Signed)
Same

## 2017-01-07 ENCOUNTER — Ambulatory Visit: Payer: 59 | Admitting: Occupational Therapy

## 2017-01-07 DIAGNOSIS — M6281 Muscle weakness (generalized): Secondary | ICD-10-CM

## 2017-01-07 DIAGNOSIS — R208 Other disturbances of skin sensation: Principal | ICD-10-CM

## 2017-01-07 DIAGNOSIS — G5622 Lesion of ulnar nerve, left upper limb: Secondary | ICD-10-CM

## 2017-01-07 NOTE — Therapy (Signed)
[facility] [address] [place], [place], [address] Phone: [phone]   Fax:  [phone]  Occupational Therapy Treatment  Patient Details  Name: Caroline Washington MRN: [id number] Date of Birth: Jun 27, 1990 Referring Provider: Fabian SharpPanosh   Encounter Date: 01/07/2017  OT End of Session - 01/07/17 1452    Visit Number  5    Number of Visits  10    Date for OT Re-Evaluation  01/22/17    OT Start Time  1305    OT Stop Time  1332    OT Time Calculation (min)  27 min    Activity Tolerance  Patient tolerated treatment well    Behavior During Therapy  [facility] for tasks assessed/performed       Past Medical History:  Diagnosis Date  . Calcium oxalate renal stones    If if calcium   . History of varicella   . PCO (polycystic ovaries)    hair and irrg periods ? us  . Urinary frequency 11/07   no cause    Past Surgical History:  Procedure Laterality Date  . TYMPANOSTOMY TUBE PLACEMENT      There were no vitals filed for this visit.  Subjective Assessment - 01/07/17 1449    Subjective   I did okay sitting in class and driving much better with sleeve on - but my nerve did not like me cooking Thursday with thanksgiving and I did pull weight bar of 150lbs down yesterday for pt and  did not like that     Patient Stated Goals  Want the numbness better - workout out again    Currently in Pain?  No/denies        Still positive Tinel and tender over cubital tunnel  Pt to keep elbow pad on during flexion act -  Appear propping act doing better - like driving and sitting in class  Elbow pad still appear to have enough cushioning - will make new one next session            OT Treatments/Exercises (OP) - 01/07/17 0001      Iontophoresis   Type of Iontophoresis  Dexamethasone    Location  L Cubital tunnel    Dose  med patch , 2.0 current     Time  19 min              OT Education - 01/07/17 1452     Education provided  Yes    Education Details  progress with ionto and sleeve use during flexion     Person(s) Educated  Patient    Methods  Explanation;Demonstration;Tactile cues    Comprehension  Verbalized understanding;Returned demonstration;Verbal cues required       OT Short Term Goals - 12/18/16 1432      OT SHORT TERM GOAL #1   Title  Numbness decrease in L UE to about 3-5 x day with activities and not more than 4/10     Baseline  at end of day numbness increase to 8/10 - and constant at moment distal from elbow to 5th     Time  3    Period  Weeks    Status  New    Target Date  01/08/17      OT SHORT TERM GOAL #2   Title  Pt to be independent in HEP  including using elbow pad correctly , to decrease numbness , increase flexibiltiy with nerve glide  Baseline  some knowledge on positioning     Time  3    Period  Weeks    Status  New    Target Date  01/08/17        OT Long Term Goals - 12/18/16 1434      OT LONG TERM GOAL #1   Title  Pt numbness in L UE decrease on PRWHE scale with more than 20 points     Baseline  at eval numbness score for 32/50     Time  5    Period  Weeks    Status  New    Target Date  01/22/17      OT LONG TERM GOAL #2   Title  Pt L UE symptoms and function return to prior level of function     Baseline  Favor arm during sleeping ,in class sitting , driving , working with pt on clinical , working on computer - cannot do yoga or  workout in gym     Time  5    Period  Weeks    Status  New    Target Date  01/22/17            Plan - 01/07/17 1453    Clinical Impression Statement  Pt had 4 sessions of ionto with dexamethazone - pt to wear elbow pad during flexion act - appear that bothers her now more than propping it up - still tender and positive tinel over cubital tunnel     Occupational performance deficits (Please refer to evaluation for details):  ADL's;IADL's;Play;Work;Leisure    OT Frequency  2x / week    OT Duration  4 weeks     OT Treatment/Interventions  Self-care/ADL training;Patient/family education;Therapeutic exercises;Contrast Bath;Iontophoresis;Manual Therapy    Plan  5 session next session     Clinical Decision Making  Limited treatment options, no task modification necessary    OT Home Exercise Plan  see pt instruction     Consulted and Agree with Plan of Care  Patient       Patient will benefit from skilled therapeutic intervention in order to improve the following deficits and impairments:     Visit Diagnosis: Other disturbances of skin sensation  Cubital tunnel syndrome on left  Muscle weakness (generalized)    Problem List Patient Active Problem List   Diagnosis Date Noted  . PCOS (polycystic ovarian syndrome) 07/31/2014  . Hx of urinary stone 07/31/2014  . Eyelid inflammation 01/14/2011  . Visit for preventive health examination 01/14/2011  . Folliculitis 10/08/2010  . Heel pain 05/20/2010  . Calcium oxalate renal stones   . Renal stone 04/18/2010  . OBESITY 08/17/2009  . POLYCYSTIC OVARIAN DISEASE 08/28/2008  . FREQUENCY, URINARY 08/25/2008  . CHICKENPOX, HX OF 08/25/2008    [name], Chelci Wintermute OTR/L,[name] 01/07/2017, 2:55 PM  [place] [facility] [address] [place], [place], [address] Phone: [phone]   Fax:  [phone]  Name: Caroline Washington MRN: [id number] Date of Birth: [birth year]

## 2017-01-07 NOTE — Patient Instructions (Signed)
Appear flexion and repetitive flexion and extention bother her more now than propping up - pt to rotate sleeve to volar elbow during flexion act

## 2017-01-09 ENCOUNTER — Ambulatory Visit: Payer: 59 | Admitting: Occupational Therapy

## 2017-01-09 DIAGNOSIS — R208 Other disturbances of skin sensation: Principal | ICD-10-CM

## 2017-01-09 DIAGNOSIS — M6281 Muscle weakness (generalized): Secondary | ICD-10-CM

## 2017-01-09 DIAGNOSIS — G5622 Lesion of ulnar nerve, left upper limb: Secondary | ICD-10-CM

## 2017-01-09 NOTE — Patient Instructions (Signed)
Same

## 2017-01-09 NOTE — Therapy (Signed)
[facility] [address] [place], [place], [address] Phone: [phone]   Fax:  [phone]  Occupational Therapy Treatment  Patient Details  Name: Caroline PalmaHaley A Washington MRN: [id number] Date of Birth: [birth year] Referring Provider: Fabian SharpPanosh   Encounter Date: 01/09/2017  OT End of Session - 01/09/17 1959    Visit Number  6    Number of Visits  10    Date for OT Re-Evaluation  01/22/17    OT Start Time  1153    OT Stop Time  1228    OT Time Calculation (min)  35 min    Activity Tolerance  Patient tolerated treatment well    Behavior During Therapy  [facility] for tasks assessed/performed       Past Medical History:  Diagnosis Date  . Calcium oxalate renal stones    If if calcium   . History of varicella   . PCO (polycystic ovaries)    hair and irrg periods ? us  . Urinary frequency 11/07   no cause    Past Surgical History:  Procedure Laterality Date  . TYMPANOSTOMY TUBE PLACEMENT      There were no vitals filed for this visit.  Subjective Assessment - 01/09/17 1944    Subjective   Doing okay - did not bother me this am - I feel it more now when moving it during day lifting or pulling - about the same tenderness     Patient Stated Goals  Want the numbness better - workout out again    Currently in Pain?  No/denies         soft tissue mobs done with Graston tools sweeping tool nr 2 on volar and dorsal forearm   prior to ionto   Ulnar N glide to cont with  And replace padding in elbow sleeve and review wearing and position of L arm during repetitive flexion and extention act   skin check done prior and afterwards - pt to wear medicine pad for about hour afterwards           OT Treatments/Exercises (OP) - 01/09/17 0001      Iontophoresis   Type of Iontophoresis  Dexamethasone    Location  L Cubital tunnel    Dose  med patch , 2.0 current     Time  19 min              OT Education -  01/09/17 1959    Education provided  Yes    Education Details  applied new padding to elbow pad     Person(s) Educated  Patient    Methods  Explanation;Demonstration    Comprehension  Verbalized understanding       OT Short Term Goals - 12/18/16 1432      OT SHORT TERM GOAL #1   Title  Numbness decrease in L UE to about 3-5 x day with activities and not more than 4/10     Baseline  at end of day numbness increase to 8/10 - and constant at moment distal from elbow to 5th     Time  3    Period  Weeks    Status  New    Target Date  01/08/17      OT SHORT TERM GOAL #2   Title  Pt to be independent in HEP  including using elbow pad correctly , to decrease numbness , increase flexibiltiy with nerve glide     Baseline  some knowledge on positioning     Time  3    Period  Weeks    Status  New    Target Date  01/08/17        OT Long Term Goals - 12/18/16 1434      OT LONG TERM GOAL #1   Title  Pt numbness in L UE decrease on PRWHE scale with more than 20 points     Baseline  at eval numbness score for 32/50     Time  5    Period  Weeks    Status  New    Target Date  01/22/17      OT LONG TERM GOAL #2   Title  Pt L UE symptoms and function return to prior level of function     Baseline  Favor arm during sleeping ,in class sitting , driving , working with pt on clinical , working on computer - cannot do yoga or  workout in gym     Time  5    Period  Weeks    Status  New    Target Date  01/22/17            Plan - 01/09/17 2000    Clinical Impression Statement  Pt had 6th session of ionto this date - doing better with symptoms in sitting in class, driving and sleeping - still with moving - repetitive flexion and extention - pt to cont to wear elbow pad      Occupational performance deficits (Please refer to evaluation for details):  ADL's;IADL's;Play;Leisure;Work    OT Frequency  2x / week    OT Duration  4 weeks    OT Treatment/Interventions  Self-care/ADL  training;Patient/family education;Manual Therapy;Iontophoresis;Therapeutic exercise;Ultrasound    Clinical Decision Making  Limited treatment options, no task modification necessary    OT Home Exercise Plan  see pt instruction     Consulted and Agree with Plan of Care  Patient       Patient will benefit from skilled therapeutic intervention in order to improve the following deficits and impairments:  Impaired flexibility, Impaired UE functional use, Decreased strength, Decreased knowledge of precautions, Impaired sensation  Visit Diagnosis: Other disturbances of skin sensation  Cubital tunnel syndrome on left  Muscle weakness (generalized)    Problem List Patient Active Problem List   Diagnosis Date Noted  . PCOS (polycystic ovarian syndrome) 07/31/2014  . Hx of urinary stone 07/31/2014  . Eyelid inflammation 01/14/2011  . Visit for preventive health examination 01/14/2011  . Folliculitis 10/08/2010  . Heel pain 05/20/2010  . Calcium oxalate renal stones   . Renal stone 04/18/2010  . OBESITY 08/17/2009  . POLYCYSTIC OVARIAN DISEASE 08/28/2008  . FREQUENCY, URINARY 08/25/2008  . CHICKENPOX, HX OF 08/25/2008    Oletta Caroline Washington, Caroline Washington/L,[name] 01/09/2017, 8:03 PM  [facility] [address] , [place], [address] Phone: [phone]   Fax:  [phone]  Name: Caroline PalmaHaley A Zaragosa MRN: [id number] Date of Birth: [birth year]

## 2017-01-13 ENCOUNTER — Ambulatory Visit: Payer: 59 | Attending: Internal Medicine | Admitting: Occupational Therapy

## 2017-01-13 DIAGNOSIS — G5622 Lesion of ulnar nerve, left upper limb: Secondary | ICD-10-CM | POA: Diagnosis present

## 2017-01-13 DIAGNOSIS — R208 Other disturbances of skin sensation: Secondary | ICD-10-CM | POA: Diagnosis present

## 2017-01-13 DIAGNOSIS — M6281 Muscle weakness (generalized): Secondary | ICD-10-CM | POA: Insufficient documentation

## 2017-01-13 NOTE — Therapy (Signed)
[facility] [address] [place], [place], [address] Phone: [phone]   Fax:  [phone]  Occupational Therapy Treatment  Patient Details  Name: Caroline PalmaHaley A Wayne MRN: [id number] Date of Birth: 06/27/1990 Referring Provider: Fabian SharpPanosh   Encounter Date: 01/13/2017  OT End of Session - 01/13/17 1447    Visit Number  7    Number of Visits  10    Date for OT Re-Evaluation  01/22/17    OT Start Time  1408    OT Stop Time  1433    OT Time Calculation (min)  25 min    Activity Tolerance  Patient tolerated treatment well    Behavior During Therapy  [facility] for tasks assessed/performed       Past Medical History:  Diagnosis Date  . Calcium oxalate renal stones    If if calcium   . History of varicella   . PCO (polycystic ovaries)    hair and irrg periods ? us  . Urinary frequency 11/07   no cause    Past Surgical History:  Procedure Laterality Date  . TYMPANOSTOMY TUBE PLACEMENT      There were no vitals filed for this visit.  Subjective Assessment - 01/13/17 1446    Subjective   About the same - but I have to say when it does go numb or pins and needles - it is not as intense - and it is much better than when we started - follow up with neurologist on 20th Dec     Patient Stated Goals  Want the numbness better - workout out again    Currently in Pain?  No/denies       Still tender in cubital tunnel - and positive Tinel - pt report that not as intense      Review Ulnar N glide - can do full range with no pull anymore       Skin check done prior - no issues  OT Treatments/Exercises (OP) - 01/13/17 0001      Iontophoresis   Type of Iontophoresis  Dexamethasone    Location  L Cubital tunnel    Dose  med patch , 2.0 current     Time  19 min              OT Education - 01/13/17 1447    Education provided  Yes    Education Details  cont same HEP -     Person(s) Educated  Patient    Methods   Explanation;Demonstration;Tactile cues    Comprehension  Verbalized understanding;Need further instruction;Returned demonstration       OT Short Term Goals - 12/18/16 1432      OT SHORT TERM GOAL #1   Title  Numbness decrease in L UE to about 3-5 x day with activities and not more than 4/10     Baseline  at end of day numbness increase to 8/10 - and constant at moment distal from elbow to 5th     Time  3    Period  Weeks    Status  New    Target Date  01/08/17      OT SHORT TERM GOAL #2   Title  Pt to be independent in HEP  including using elbow pad correctly , to decrease numbness , increase flexibiltiy with nerve glide     Baseline  some knowledge on positioning     Time  3    Period  Weeks    Status  New    Target Date  01/08/17        OT Long Term Goals - 12/18/16 1434      OT LONG TERM GOAL #1   Title  Pt numbness in L UE decrease on PRWHE scale with more than 20 points     Baseline  at eval numbness score for 32/50     Time  5    Period  Weeks    Status  New    Target Date  01/22/17      OT LONG TERM GOAL #2   Title  Pt L UE symptoms and function return to prior level of function     Baseline  Favor arm during sleeping ,in class sitting , driving , working with pt on clinical , working on computer - cannot do yoga or  workout in gym     Time  5    Period  Weeks    Status  New    Target Date  01/22/17            Plan - 01/13/17 1448    Clinical Impression Statement  Pt had 7th session of ionto thist date - report ulnar N glide can do full motion and no pull anymore - and numbness or pins and needles not as intense anymore     Occupational performance deficits (Please refer to evaluation for details):  ADL's;IADL's;Work;Play;Leisure    Rehab Potential  Good    OT Frequency  2x / week    OT Duration  2 weeks    OT Treatment/Interventions  Self-care/ADL training;Patient/family education;Manual Therapy;Iontophoresis;Therapeutic exercise;Ultrasound    Plan   ionto again for 8th     Clinical Decision Making  Limited treatment options, no task modification necessary    OT Home Exercise Plan  see pt instruction     Consulted and Agree with Plan of Care  Patient       Patient will benefit from skilled therapeutic intervention in order to improve the following deficits and impairments:  Impaired flexibility, Impaired UE functional use, Decreased strength, Decreased knowledge of precautions, Impaired sensation  Visit Diagnosis: Other disturbances of skin sensation  Cubital tunnel syndrome on left  Muscle weakness (generalized)    Problem List Patient Active Problem List   Diagnosis Date Noted  . PCOS (polycystic ovarian syndrome) 07/31/2014  . Hx of urinary stone 07/31/2014  . Eyelid inflammation 01/14/2011  . Visit for preventive health examination 01/14/2011  . Folliculitis 10/08/2010  . Heel pain 05/20/2010  . Calcium oxalate renal stones   . Renal stone 04/18/2010  . OBESITY 08/17/2009  . POLYCYSTIC OVARIAN DISEASE 08/28/2008  . FREQUENCY, URINARY 08/25/2008  . CHICKENPOX, HX OF 08/25/2008    [name], Aurelius Gildersleeve OTR/L,[name] 01/13/2017, 2:50 PM  [facility] [address] [place], [place], [address] Phone: [phone]   Fax:  [phone]  Name: Caroline PalmaHaley A Dante MRN: [id number] Date of Birth: [birth year]

## 2017-01-13 NOTE — Patient Instructions (Signed)
Same

## 2017-01-15 ENCOUNTER — Ambulatory Visit: Payer: 59 | Admitting: Occupational Therapy

## 2017-01-15 DIAGNOSIS — R208 Other disturbances of skin sensation: Principal | ICD-10-CM

## 2017-01-15 DIAGNOSIS — M6281 Muscle weakness (generalized): Secondary | ICD-10-CM

## 2017-01-15 DIAGNOSIS — G5622 Lesion of ulnar nerve, left upper limb: Secondary | ICD-10-CM

## 2017-01-15 NOTE — Patient Instructions (Signed)
Provided new padding for elbow pad - and to cont with same HEP but not working out with L arm yet

## 2017-01-15 NOTE — Therapy (Signed)
[facility] [address] [place], [place], [address] Phone: [phone]   Fax:  [phone]  Occupational Therapy Treatment  Patient Details  Name: Caroline Washington MRN: [id number] Date of Birth: 16-Nov-1990 Referring Provider: Fabian SharpPanosh   Encounter Date: 01/15/2017  OT End of Session - 01/15/17 0854    Visit Number  8    Number of Visits  10    Date for OT Re-Evaluation  01/22/17    OT Start Time  0800    OT Stop Time  0831    OT Time Calculation (min)  31 min    Activity Tolerance  Patient tolerated treatment well    Behavior During Therapy  [facility] for tasks assessed/performed       Past Medical History:  Diagnosis Date  . Calcium oxalate renal stones    If if calcium   . History of varicella   . PCO (polycystic ovaries)    hair and irrg periods ? us  . Urinary frequency 11/07   no cause    Past Surgical History:  Procedure Laterality Date  . TYMPANOSTOMY TUBE PLACEMENT      There were no vitals filed for this visit.  Subjective Assessment - 01/15/17 0853    Subjective   Doing better - less numbness and not as intense - and not down into my fingers anymore     Patient Stated Goals  Want the numbness better - workout out again    Currently in Pain?  No/denies       Pt still tender over cubital tunnel - but numbness not into 4th and  5th anymore per pt  skin check done -            OT Treatments/Exercises (OP) - 01/15/17 0001      Iontophoresis   Type of Iontophoresis  Dexamethasone    Location  L Cubital tunnel    Dose  med patch , 2.0 current     Time  19 min       keep patch on for about hour  And new padding provided for elbow sleeve        OT Education - 01/15/17 0854    Education provided  Yes    Education Details  progress     Person(s) Educated  Patient    Methods  Explanation;Demonstration;Tactile cues    Comprehension  Verbalized understanding;Returned demonstration        OT Short Term Goals - 12/18/16 1432      OT SHORT TERM GOAL #1   Title  Numbness decrease in L UE to about 3-5 x day with activities and not more than 4/10     Baseline  at end of day numbness increase to 8/10 - and constant at moment distal from elbow to 5th     Time  3    Period  Weeks    Status  New    Target Date  01/08/17      OT SHORT TERM GOAL #2   Title  Pt to be independent in HEP  including using elbow pad correctly , to decrease numbness , increase flexibiltiy with nerve glide     Baseline  some knowledge on positioning     Time  3    Period  Weeks    Status  New    Target Date  01/08/17        OT Long Term Goals - 12/18/16 1434  OT LONG TERM GOAL #1   Title  Pt numbness in L UE decrease on PRWHE scale with more than 20 points     Baseline  at eval numbness score for 32/50     Time  5    Period  Weeks    Status  New    Target Date  01/22/17      OT LONG TERM GOAL #2   Title  Pt L UE symptoms and function return to prior level of function     Baseline  Favor arm during sleeping ,in class sitting , driving , working with pt on clinical , working on computer - cannot do yoga or  workout in gym     Time  5    Period  Weeks    Status  New    Target Date  01/22/17            Plan - 01/15/17 0855    Clinical Impression Statement  Pt 8th session this date - pt report decrease in numbness episodes and not as intense and not into 4thand 5th digits anymore - pt to cont with elbow pad - provided new padding and  no working out yet with L arm     Occupational performance deficits (Please refer to evaluation for details):  ADL's;IADL's;Work;Play;Leisure    Rehab Potential  Good    OT Frequency  1x / week    OT Duration  2 weeks    OT Treatment/Interventions  Self-care/ADL training;Patient/family education;Manual Therapy;Iontophoresis;Therapeutic exercise;Ultrasound    Plan  ionto again for 9th - decrease to 1 x wk for 2 wks - appt with MD in 2 wks      Clinical Decision Making  Limited treatment options, no task modification necessary    OT Home Exercise Plan  see pt instruction     Consulted and Agree with Plan of Care  Patient       Patient will benefit from skilled therapeutic intervention in order to improve the following deficits and impairments:  Impaired flexibility, Impaired UE functional use, Decreased strength, Decreased knowledge of precautions, Impaired sensation  Visit Diagnosis: Other disturbances of skin sensation  Cubital tunnel syndrome on left  Muscle weakness (generalized)    Problem List Patient Active Problem List   Diagnosis Date Noted  . PCOS (polycystic ovarian syndrome) 07/31/2014  . Hx of urinary stone 07/31/2014  . Eyelid inflammation 01/14/2011  . Visit for preventive health examination 01/14/2011  . Folliculitis 10/08/2010  . Heel pain 05/20/2010  . Calcium oxalate renal stones   . Renal stone 04/18/2010  . OBESITY 08/17/2009  . POLYCYSTIC OVARIAN DISEASE 08/28/2008  . FREQUENCY, URINARY 08/25/2008  . CHICKENPOX, HX OF 08/25/2008    [name], [name] OTR/L,[name] 01/15/2017, 8:56 AM  [facility] [address] [place], [place], [address] Phone: [phone]   Fax:  [phone]  Name: Caroline Washington MRN: [id number] Date of Birth: 1990-12-05

## 2017-01-19 ENCOUNTER — Encounter: Payer: Self-pay | Admitting: Internal Medicine

## 2017-01-22 ENCOUNTER — Ambulatory Visit: Payer: 59 | Admitting: Occupational Therapy

## 2017-01-22 DIAGNOSIS — M6281 Muscle weakness (generalized): Secondary | ICD-10-CM

## 2017-01-22 DIAGNOSIS — R208 Other disturbances of skin sensation: Principal | ICD-10-CM

## 2017-01-22 DIAGNOSIS — G5622 Lesion of ulnar nerve, left upper limb: Secondary | ICD-10-CM

## 2017-01-22 NOTE — Patient Instructions (Signed)
Same

## 2017-01-22 NOTE — Therapy (Signed)
[place] [facility] [address] [place], [place], [address] Phone: [phone]   Fax:  [phone]  Occupational Therapy Treatment  Patient Details  Name: Caroline Washington MRN: [id number] Date of Birth: 05/14/1990 Referring Provider (Historical): Panosh   Encounter Date: 01/22/2017  OT End of Session - 01/22/17 1623    Visit Number  9    Number of Visits  10    Date for OT Re-Evaluation  01/22/17    OT Start Time  1138    OT Stop Time  1206    OT Time Calculation (min)  28 min    Activity Tolerance  Patient tolerated treatment well    Behavior During Therapy  [facility] for tasks assessed/performed       Past Medical History:  Diagnosis Date  . Calcium oxalate renal stones    If if calcium   . History of varicella   . PCO (polycystic ovaries)    hair and irrg periods ? [place]  . Urinary frequency 11/07   no cause    Past Surgical History:  Procedure Laterality Date  . TYMPANOSTOMY TUBE PLACEMENT      There were no vitals filed for this visit.  Subjective Assessment - 01/22/17 1622    Subjective   Doing better - only feels the numbness in forearm - distal to elbow - but not really numbness and only about 2 times a day - and not as intense     Patient Stated Goals  Want the numbness better - workout out again    Currently in Pain?  No/denies       Pt not as tender over  cubital tunnel - and numbness or more pins and needles now only in proximal medial forearm  not into 4th and  5th anymore per pt  skin check done - prior to ionto               OT Treatments/Exercises (OP) - 01/22/17 0001      Iontophoresis   Type of Iontophoresis  Dexamethasone    Location  L Cubital tunnel    Dose  med patch , 2.0 current     Time  19 min              OT Education - 01/22/17 1623    Education provided  Yes    Education Details  discuss progress     Person(s) Educated  Patient    Methods  Explanation    Comprehension  Verbalized understanding;Returned demonstration       OT Short Term Goals - 12/18/16 1432      OT SHORT TERM GOAL #1   Title  Numbness decrease in L UE to about 3-5 x day with activities and not more than 4/10     Baseline  at end of day numbness increase to 8/10 - and constant at moment distal from elbow to 5th     Time  3    Period  Weeks    Status  New    Target Date  01/08/17      OT SHORT TERM GOAL #2   Title  Pt to be independent in HEP  including using elbow pad correctly , to decrease numbness , increase flexibiltiy with nerve glide     Baseline  some knowledge on positioning     Time  3    Period  Weeks    Status  New  Target Date  01/08/17        OT Long Term Goals - 12/18/16 1434      OT LONG TERM GOAL #1   Title  Pt numbness in L UE decrease on PRWHE scale with more than 20 points     Baseline  at eval numbness score for 32/50     Time  5    Period  Weeks    Status  New    Target Date  01/22/17      OT LONG TERM GOAL #2   Title  Pt L UE symptoms and function return to prior level of function     Baseline  Favor arm during sleeping ,in class sitting , driving , working with pt on clinical , working on computer - cannot do yoga or  workout in gym     Time  5    Period  Weeks    Status  New    Target Date  01/22/17            Plan - 01/22/17 1624    Clinical Impression Statement  Pt had 9th session of ionto this date- report numbness more proximal in forearm now and not as intense of often - only about 2 times day - and not really numb - pins and needles feeling -and not only with pushing on elbow or repetive flexion and extentoin  - Tinel more proximal and  Cubital tunnel not as tender     Occupational performance deficits (Please refer to evaluation for details):  ADL's;IADL's;Work;Play;Leisure    OT Frequency  1x / week    OT Duration  2 weeks    OT Treatment/Interventions  Self-care/ADL training;Patient/family education;Manual  Therapy;Iontophoresis;Therapeutic exercise;Ultrasound    Plan  ionto again for 10th  - decrease to 1 x wk for 1 wks - appt with MD on 20th     Clinical Decision Making  Limited treatment options, no task modification necessary    OT Home Exercise Plan  see pt instruction     Consulted and Agree with Plan of Care  Patient       Patient will benefit from skilled therapeutic intervention in order to improve the following deficits and impairments:  Impaired flexibility, Impaired UE functional use, Decreased strength, Decreased knowledge of precautions, Impaired sensation  Visit Diagnosis: Other disturbances of skin sensation  Cubital tunnel syndrome on left  Muscle weakness (generalized)    Problem List Patient Active Problem List   Diagnosis Date Noted  . PCOS (polycystic ovarian syndrome) 07/31/2014  . Hx of urinary stone 07/31/2014  . Eyelid inflammation 01/14/2011  . Visit for preventive health examination 01/14/2011  . Folliculitis 10/08/2010  . Heel pain 05/20/2010  . Calcium oxalate renal stones   . Renal stone 04/18/2010  . OBESITY 08/17/2009  . POLYCYSTIC OVARIAN DISEASE 08/28/2008  . FREQUENCY, URINARY 08/25/2008  . CHICKENPOX, HX OF 08/25/2008    [name], Kriston Pasquarello OTR/L,[name] 01/22/2017, 4:27 PM  [place] [facility] [address] [place], [place], [address] Phone: [phone]   Fax:  [phone]  Name: Caroline Washington MRN: [id number] Date of Birth: 07/05/1990

## 2017-01-28 ENCOUNTER — Ambulatory Visit: Payer: 59 | Admitting: Occupational Therapy

## 2017-01-28 DIAGNOSIS — R208 Other disturbances of skin sensation: Secondary | ICD-10-CM | POA: Diagnosis not present

## 2017-01-28 DIAGNOSIS — G5622 Lesion of ulnar nerve, left upper limb: Secondary | ICD-10-CM

## 2017-01-28 DIAGNOSIS — M6281 Muscle weakness (generalized): Secondary | ICD-10-CM

## 2017-01-28 NOTE — Patient Instructions (Signed)
Cont with elbow pads- provided 2 xtras  hold of on working out for few more months  And mostly during sitting , in class, driving and sleeping

## 2017-01-28 NOTE — Therapy (Signed)
[place] PHYSICAL AND SPORTS MEDICINE [address], [place], [address] Phone: [phone]   Fax:  [phone]  Occupational Therapy Treatment  Patient Details  Name: Caroline Washington MRN: [id number] Date of Birth: Jan 27, 1991 Referring Provider (Historical): Panosh   Encounter Date: 01/28/2017  OT End of Session - 01/28/17 2105    Visit Number  10    Number of Visits  10    Date for OT Re-Evaluation  01/28/17    OT Start Time  1150    OT Stop Time  1233    OT Time Calculation (min)  43 min    Activity Tolerance  Patient tolerated treatment well    Behavior During Therapy  [facility] for tasks assessed/performed       Past Medical History:  Diagnosis Date  . Calcium oxalate renal stones    If if calcium   . History of varicella   . PCO (polycystic ovaries)    hair and irrg periods ? [place]  . Urinary frequency 11/07   no cause    Past Surgical History:  Procedure Laterality Date  . TYMPANOSTOMY TUBE PLACEMENT      There were no vitals filed for this visit.  Subjective Assessment - 01/28/17 2103    Subjective   MY L arm is much better - my pinkie and ring finger use to be numb all the time - now only 2-3 x day when sleeping or sitting or pushing on it - and only on my forearm close to elbow      Patient Stated Goals  Want the numbness better - workout out again    Currently in Pain?  No/denies         [facility] OT Assessment - 01/28/17 0001      Strength   Right Hand Grip (lbs)  80    Right Hand Lateral Pinch  18 lbs    Right Hand 3 Point Pinch  17 lbs    Left Hand Grip (lbs)  68    Left Hand Lateral Pinch  17 lbs    Left Hand 3 Point Pinch  16 lbs      Froments negative  ADD of thumb strength increase  Fabricated 2 new elbow pad to use  Soft tissue palpated - at forearm and cubital tunnel - Tinel in forearm - not at Cubital tunnel  ASSess and done PRWHE for numbness improve to 6/50 from  32/50  And function score 1.5/50          OT Treatments/Exercises (OP) - 01/28/17 0001      Iontophoresis   Type of Iontophoresis  Dexamethasone    Location  L Cubital tunnel    Dose  med patch , 2.0 current     Time  19 min              OT Education - 01/28/17 2105    Education provided  Yes    Education Details  HEP and use of elbow pads -and nerve healing     Person(s) Educated  Patient    Methods  Demonstration;Tactile cues;Verbal cues;Explanation    Comprehension  Verbalized understanding       OT Short Term Goals - 01/28/17 2108      OT SHORT TERM GOAL #1   Title  Numbness decrease in L UE to about 3-5 x day with activities and not more than 4/10     Baseline  only 2-3 episode a day -  but only in forearm- numbness at worse 4/10     Status  Achieved      OT SHORT TERM GOAL #2   Title  Pt to be independent in HEP  including using elbow pad correctly , to decrease numbness , increase flexibiltiy with nerve glide     Status  Achieved        OT Long Term Goals - 01/28/17 2109      OT LONG TERM GOAL #1   Title  Pt numbness in L UE decrease on PRWHE scale with more than 20 points     Baseline  at eval numbness score for 32/50  and now 6/50     Status  Achieved      OT LONG TERM GOAL #2   Title  Pt L UE symptoms and function return to prior level of function     Baseline  not working out in gym -     Status  Partially Met            Plan - 01/28/17 2106    Clinical Impression Statement  Pt showed great progress in ulnar N compression at Cubital tunnel on L side - pt only have still 2-3 episode of numnbess in forearm - no symptoms in fingers- and only when sitting or flexion of elbow extreme- pt show grip and prehension strenght WNL and negative Froments sign - ADD of 5th getting stronger - pt to be discharge with recommending using elbow pad for few more months and keeping off on workout in gym - pt to see MD tomorrow     OT Treatment/Interventions  Self-care/ADL  training;Patient/family education;Manual Therapy;Iontophoresis;Therapeutic exercise;Ultrasound    Plan  c ont with elbow pad     Clinical Decision Making  Limited treatment options, no task modification necessary    OT Home Exercise Plan  see pt instruction     Consulted and Agree with Plan of Care  Patient       Patient will benefit from skilled therapeutic intervention in order to improve the following deficits and impairments:     Visit Diagnosis: Cubital tunnel syndrome on left  Muscle weakness (generalized)  Other disturbances of skin sensation    Problem List Patient Active Problem List   Diagnosis Date Noted  . PCOS (polycystic ovarian syndrome) 07/31/2014  . Hx of urinary stone 07/31/2014  . Eyelid inflammation 01/14/2011  . Visit for preventive health examination 01/14/2011  . Folliculitis 40/76/8088  . Heel pain 05/20/2010  . Calcium oxalate renal stones   . Renal stone 04/18/2010  . OBESITY 08/17/2009  . POLYCYSTIC OVARIAN DISEASE 08/28/2008  . FREQUENCY, URINARY 08/25/2008  . CHICKENPOX, HX OF 08/25/2008    Rosalyn Gess OTR/L,[name]  01/28/2017, 9:11 PM  [place] PHYSICAL AND SPORTS MEDICINE [address] [place], [address] Phone: [phone]   Fax:  [phone]  Name: Caroline Washington MRN: [id number] Date of Birth: 12-08-1990

## 2017-01-29 ENCOUNTER — Ambulatory Visit: Payer: 59 | Admitting: Neurology

## 2017-01-29 NOTE — Progress Notes (Deleted)
Follow-up Visit   Date: ***   Caroline Washington MRN: [id number] DOB: 15-Dec-1990   Interim History: Caroline PalmaHaley A Barman is a 26 y.o. right-handed Caucasian female with PCOS and anxiety returning to the clinic for follow-up of left ulnar neuropathy and headaches.  The patient was accompanied to the clinic by *** who also provides collateral information.    History of present illness: Starting in May 2018, she started having numbness over the left chest/breast region, lateral hand, and an area above the knee. She went to the ER in early Mayand was given headache cocktail which alleviated her symptoms, however they returned within the following week and since then, she has constant tingling until July/August.  For the past several years, she also as intermittent hand tingling/numbnessm, worse on the left.  Because of her sporadic symptoms, she had MRI brain to evaluate for demyelinating disease, which returned normal.  MRI cervical spine showed small disc protrusion at C6-7.  She was referred here for further evaluation and management.  Because of anxiety related to her health, she was started on zoloft.  She reports having neck tension and headaches about once every 1-2 weeks. Pain is achy and throbbing.  She has some photophobia, but no nausea or vomiting.  She usually does not treat these headaches.  Usually they are ranked as 2-6/10.  Her mother had history of migraines.   UPDATE ***:  Medications:  Current Outpatient Medications on File Prior to Visit  Medication Sig Dispense Refill  . benzonatate (TESSALON) 200 MG capsule Take 1 capsule (200 mg total) 2 (two) times daily as needed by mouth for cough. 20 capsule 0  . HYDROcodone-homatropine (HYCODAN) 5-1.5 MG/5ML syrup Take 5 mLs every 8 (eight) hours as needed by mouth for cough. 120 mL 0  . sertraline (ZOLOFT) 100 MG tablet Take 1 tablet daily by mouth.    . vitamin B-12 (CYANOCOBALAMIN) 1000 MCG tablet Take 1,000 mcg daily by mouth.      No current facility-administered medications on file prior to visit.     Allergies: No Known Allergies  Review of Systems:  CONSTITUTIONAL: No fevers, chills, night sweats, or weight loss.  EYES: No visual changes or eye pain ENT: No hearing changes.  No history of nose bleeds.   RESPIRATORY: No cough, wheezing and shortness of breath.   CARDIOVASCULAR: Negative for chest pain, and palpitations.   GI: Negative for abdominal discomfort, blood in stools or black stools.  No recent change in bowel habits.   GU:  No history of incontinence.   MUSCLOSKELETAL: No history of joint pain or swelling.  No myalgias.   SKIN: Negative for lesions, rash, and itching.   ENDOCRINE: Negative for cold or heat intolerance, polydipsia or goiter.   PSYCH:  *** depression or anxiety symptoms.   NEURO: As Above.   Vital Signs:  There were no vitals taken for this visit. Pain Scale: *** on a scale of 0-10   General: *** Neck: *** carotid bruit CV: *** Ext: ***  Neurological Exam: MENTAL STATUS including orientation to time, place, person, recent and remote memory, attention span and concentration, language, and fund of knowledge is ***normal.  Speech is not dysarthric.  CRANIAL NERVES: No visual field defects. *** Pupils equal round and reactive to light.  Normal conjugate, extra-ocular eye movements in all directions of gaze.  No ptosis ***. Normal facial sensation.  Face is symmetric. Palate elevates symmetrically.  Tongue is midline.  MOTOR:  Motor strength is  5/5 in all extremities, except ***.  No atrophy, fasciculations or abnormal movements.  No pronator drift.  Tone is normal.    MSRs:  Reflexes are 2+/4 throughout, except ***.  SENSORY:  Intact to ***.  COORDINATION/GAIT:  Normal finger-to- nose-finger and heel-to-shin.  Intact rapid alternating movements bilaterally.  Gait narrow based and stable.   Data:***  MRI brain wwo contrast 06/23/2016:  Normal MRI of the brain. No findings  to suggest demyelinating disease. No acute intracranial process.  MRI cervical spine wwo contrast 06/23/2016: 1. Normal MRI appearance of the cervical spinal cord. No cord signal abnormality to suggest demyelinating disease. 2. Central/left central disc protrusion at C6-7, abutting the ventral spinal cord. Minimal cord flattening without cord signal changes. 3. Otherwise normal MRI of the cervical spine.  NCS/EMG of the left upper extremity 11/11/2016:  Left ulnar neuropathy with slowing across the elbow, purely demyelinating in type.  IMPRESSION/PLAN***: 1.  Left ulnar neuropathy at the elbow, confirmed by EMG and purely demyelinating.  - Patient educated how to minimize trauma and stretching the nerve across the elbow.  She is compliant with ***  2.  Episodic tension headaches, stable and infrequent.  Continue to treat as needed, limit all OTC medications to twice per week  3.  Left chest paresthesias, nonspecific with normal exam.       - Reassurance provided that there is nothing worrisome on her imaging or exam      - If symptoms progress, consider MRI thoracic spine        Return to clinic as needed   The duration of this appointment visit was *** minutes of face-to-face time with the patient.  Greater than 50% of this time was spent in counseling, explanation of diagnosis, planning of further management, and coordination of care.   Thank you for allowing me to participate in patient's care.  If I can answer any additional questions, I would be pleased to do so.    Sincerely,    Mikai Meints K. Allena KatzPatel, DO

## 2017-02-27 ENCOUNTER — Ambulatory Visit: Payer: 59 | Admitting: Neurology

## 2017-03-02 ENCOUNTER — Telehealth: Payer: Self-pay | Admitting: *Deleted

## 2017-03-02 DIAGNOSIS — R070 Pain in throat: Secondary | ICD-10-CM

## 2017-03-02 NOTE — Telephone Encounter (Signed)
LM for Jasmine DecemberSharon to return call for mor information about why referral to ENT is needed.

## 2017-03-02 NOTE — Telephone Encounter (Signed)
Copied from CRM [phone]. Topic: Referral - Request >> Mar 02, 2017 10:09 AM Waymon AmatoBurton, Donna F wrote: Reason for CRM: pt is wanting to know if an ent referral can be made for her daughter or does she need to come in  best number is [phone]  >> Mar 02, 2017  2:12 PM Cox, Armando GangSheena H, CMA wrote: I would say pt needs to be seen, but per chart Panosh has referred her for other issues (OT/Sports Med) without seeing her.Marland Kitchen.Marland Kitchen..Marland Kitchen

## 2017-03-03 NOTE — Telephone Encounter (Signed)
Copied from CRM [id number]. Topic: General - Other >> Mar 03, 2017  1:55 PM Viviann SpareWhite, Selina wrote: Reason for CRM: Patient mother is requesting a call back from Ashtyn @ [phone], in reference to the ENT referral.

## 2017-03-04 NOTE — Telephone Encounter (Signed)
Referral placed for ENT. Patient aware also via voicemail. Nothing further needed.

## 2017-03-04 NOTE — Telephone Encounter (Signed)
I agree with referral to "[facility]"   Pl;lease make sure patient contacted also  About this

## 2017-03-04 NOTE — Telephone Encounter (Signed)
Jasmine DecemberSharon states that the patient is having right sided throat pain, "off and on" Pt thinks that she may have a tonsil stone that is inflamed.  Mother states that her tonsils should have come years ago and now they are huge.  [facility]   Please advise Dr Fabian SharpPanosh, thanks.

## 2017-03-09 NOTE — Telephone Encounter (Signed)
Reason for CRM: pt mom is checking on status of referral for ENT she has not heard from the office that we were going to refer her to that we told her we would on 03-04-17  Best number [phone]

## 2017-03-11 ENCOUNTER — Encounter: Payer: Self-pay | Admitting: Internal Medicine

## 2017-03-17 NOTE — Telephone Encounter (Signed)
Doubt if clinically significant if no sx and  Doing ok .  Can make ov if has a concern

## 2017-03-17 NOTE — Telephone Encounter (Signed)
Dr. Panosh please advise °

## 2017-03-20 ENCOUNTER — Encounter: Payer: Self-pay | Admitting: Internal Medicine

## 2017-03-20 ENCOUNTER — Ambulatory Visit: Payer: 59 | Admitting: Internal Medicine

## 2017-03-20 VITALS — BP 118/82 | HR 85 | Temp 97.9°F | Wt 267.0 lb

## 2017-03-20 DIAGNOSIS — R002 Palpitations: Secondary | ICD-10-CM

## 2017-03-20 NOTE — Progress Notes (Signed)
Chief Complaint  Patient presents with  . Follow-up    Concerned about heart flutter. Pt got a new Apple Watch and it showed abnormal HR/beat    HPI: Caroline Washington 26 y.o.   SDA      for ongoing concern about fluttering  Heart sw problems   Fluttering at times  And has   New watch   And  Some irregularity    comes and goes but ongoing for the last  Weeks  And now   Increases with   Worse with exercise.    Not in am   Some with caffeine and  In afternoon and evening  And then when ecercises   Elliptical or  treadmill and lifting.    No  Sob or syncopee with  his  begins with in 10 minutes or beginning exercise  .      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   Hs had  ekg 2017 when was dehydrated  With a race and had  Fast heart beat.   ROS: See pertinent positives and negatives per HPI. No syncope cp with this   No osa to have  tonsiles out  Has  Mouth breathing   Past Medical History:  Diagnosis Date  . Calcium oxalate renal stones    If if calcium   . History of varicella   . PCO (polycystic ovaries)    hair and irrg periods ? [place]  . Urinary frequency 11/07   no cause    Family History  Problem Relation Age of Onset  . Hypertension Mother   . Depression Mother   . OCD Mother   . Bipolar disorder Mother   . Hypertension Father     Social History   Socioeconomic History  . Marital status: Single    Spouse name: None  . Number of children: None  . Years of education: None  . Highest education level: None  Social Needs  . Financial resource strain: None  . Food insecurity - worry: None  . Food insecurity - inability: None  . Transportation needs - medical: None  . Transportation needs - non-medical: None  Occupational History  . None  Tobacco Use  . Smoking status: Never Smoker  . Smokeless tobacco: Never Used  Substance and Sexual Activity  . Alcohol use: No  . Drug use: No  . Sexual  activity: None  Other Topics Concern  . None  Social History Narrative   Father 355'10'   Mother 5'9"   Caffeine: Coke twice weekly and coffee everyday.   HH of 3   2 cats and 3 dogs   Parents: Jasmine DecemberSharon and Rayvon CharWilliam Ronald   Sleep 7 hours      [employer]   Sports med .     Graduated recently from [employer]   Looking into PT school    Outpatient Medications Prior to Visit  Medication Sig Dispense Refill  . sertraline (ZOLOFT) 100 MG tablet Take 1 tablet daily by mouth.    . vitamin B-12 (CYANOCOBALAMIN) 1000 MCG tablet Take 1,000 mcg daily by mouth.    . benzonatate (TESSALON) 200 MG capsule Take 1 capsule (200 mg total) 2 (two) times daily as needed by mouth for cough. (Patient not taking: Reported on 03/20/2017) 20 capsule 0  . HYDROcodone-homatropine (HYCODAN) 5-1.5 MG/5ML syrup Take 5 mLs every  8 (eight) hours as needed by mouth for cough. (Patient not taking: Reported on 03/20/2017) 120 mL 0   No facility-administered medications prior to visit.      EXAM:  BP 118/82 (BP Location: Right Arm, Patient Position: Sitting, Cuff Size: Large)   Pulse 85   Temp 97.9 F (36.6 C) (Oral)   Wt 267 lb (121.1 kg)   BMI 39.43 kg/m   Body mass index is 39.43 kg/m.  GENERAL: vitals reviewed and listed above, alert, oriented, appears well hydrated and in no acute distress HEENT: atraumatic, conjunctiva  clear, no obvious abnormalities on inspection of external nose and ears  NECK: no obvious masses on inspection palpation  LUNGS: clear to auscultation bilaterally, no wheezes, rales or rhonchi, good air movement CV: HRRR, no clubbing cyanosis  No g m or clicks or  peripheral edema nl cap refill  MS: moves all extremities without noticeable focal  abnormality PSYCH: pleasant and cooperative, no obvious depression or anxiety ekg from last year NSR   ekg from apple watch shows    What looks like PAC    ASSESSMENT AND PLAN:  Discussed the following assessment and plan:  Intermittent  palpitations exercise aggravated  - Plan: Ambulatory referral to Cardiology prob pacs   And nl exam but  Gets worse with exercise and thus limiting  Activity at this time .   options discussed   Will get cards opinion about exercise   Aggravated Heart palpitations  ?  Advisability  For stress test etc    limit caffeine etc  -Patient advised to return or notify health care team  if symptoms worsen ,persist or new concerns arise.  Patient Instructions  This sounds like PACs and  Your heart exam is normal   But because worse with exercise    Will get cardiology opinion.    .  Probably a benign condition   consideration of stress test or other if needed .       And ok to do more  Exercise .     Neta Mends. Maddon Horton M.D.

## 2017-03-20 NOTE — Patient Instructions (Addendum)
This sounds like PACs and  Your heart exam is normal   But because worse with exercise    Will get cardiology opinion.    .  Probably a benign condition   consideration of stress test or other if needed .       And ok to do more  Exercise .

## 2017-04-01 ENCOUNTER — Encounter: Payer: Self-pay | Admitting: Cardiology

## 2017-04-01 ENCOUNTER — Ambulatory Visit: Payer: 59 | Admitting: Cardiology

## 2017-04-01 VITALS — BP 100/78 | HR 80 | Ht 69.0 in | Wt 263.0 lb

## 2017-04-01 DIAGNOSIS — E282 Polycystic ovarian syndrome: Secondary | ICD-10-CM | POA: Diagnosis not present

## 2017-04-01 DIAGNOSIS — N200 Calculus of kidney: Secondary | ICD-10-CM

## 2017-04-01 DIAGNOSIS — R002 Palpitations: Secondary | ICD-10-CM | POA: Diagnosis not present

## 2017-04-01 NOTE — Progress Notes (Signed)
Cardiology Consultation:    Date:  04/01/2017   ID:  Caroline Washington, DOB 05/24/1990, MRN [id number]  PCP:  Madelin Headings, MD  Cardiologist:  Gypsy Balsam, MD   Referring MD: Madelin Headings, MD   Chief Complaint  Patient presents with  . Palpitations  Palpitations  History of Present Illness:    Caroline Washington is a 27 y.o. female who is being seen today for the evaluation of palpitations at the request of Panosh, Neta Mends, MD.  For last few months she experienced palpitations.  She feels her heart stopping and skipping a beat.  She describes as fluttering but there is no sustained arrhythmia.  She is not getting these issues no passing out with it.  Denies have any chest pain tightness squeezing pressure burning chest no shortness of breath.  She is trying to lose weight and goes to gym on the regular basis she does not notice this palpitation while exercising but in the recovery phase after she is trying to come down she will notice some extra beats.  She does have apple watch and she was able to record some of her extrasystole.  It appears to be supraventricular.  Risk factors for coronary artery disease include none  Past Medical History:  Diagnosis Date  . Calcium oxalate renal stones    If if calcium   . History of varicella   . PCO (polycystic ovaries)    hair and irrg periods ? [place]  . Urinary frequency 11/07   no cause    Past Surgical History:  Procedure Laterality Date  . TYMPANOSTOMY TUBE PLACEMENT      Current Medications: Current Meds  Medication Sig  . sertraline (ZOLOFT) 100 MG tablet Take 1 tablet daily by mouth.  . vitamin B-12 (CYANOCOBALAMIN) 1000 MCG tablet Take 1,000 mcg daily by mouth.     Allergies:   Patient has no known allergies.   Social History   Socioeconomic History  . Marital status: Single    Spouse name: None  . Number of children: None  . Years of education: None  . Highest education level: None  Social Needs  . Financial  resource strain: None  . Food insecurity - worry: None  . Food insecurity - inability: None  . Transportation needs - medical: None  . Transportation needs - non-medical: None  Occupational History  . None  Tobacco Use  . Smoking status: Never Smoker  . Smokeless tobacco: Never Used  Substance and Sexual Activity  . Alcohol use: No  . Drug use: No  . Sexual activity: None  Other Topics Concern  . None  Social History Narrative   Father 34'10'   Mother 5'9"   Caffeine: Coke twice weekly and coffee everyday.   HH of 3   2 cats and 3 dogs   Parents: [name] and Rayvon Char   Sleep 7 hours      [employer] .     Graduated recently from [employer]ines[employer]   Looking into PT school     Family History: The patient's family history includes Bipolar disorder in her mother; Depression in her mother; Hypertension in her father and mother; OCD in her mother. ROS:   Please see the history of present illness.    All 14 point review of systems negative except as described per history of present illness.  EKGs/Labs/Other Studies Reviewed:    The following studies were reviewed today:   EKG:  EKG  is  ordered today.  The ekg ordered today demonstrates normal sinus rhythm, normal P interval, normal QRS complex duration morphology.  Recent Labs: 06/13/2016: ALT 14; Creatinine, Ser 0.69; Magnesium 1.8; Potassium 3.4; Sodium 141 09/30/2016: BUN 15; Hemoglobin 12.6; Platelets 205 11/11/2016: TSH 0.86  Recent Lipid Panel    Component Value Date/Time   CHOL 149 11/02/2014 0913   TRIG 77.0 11/02/2014 0913   TRIG 72 12/22/2005 0952   HDL 54.10 11/02/2014 0913   CHOLHDL 3 11/02/2014 0913   VLDL 15.4 11/02/2014 0913   LDLCALC 79 11/02/2014 0913    Physical Exam:    VS:  BP 100/78 (BP Location: Right Arm, Patient Position: Sitting, Cuff Size: Large)   Pulse 80   Ht 5\' 9"  (1.753 m)   Wt 263 lb (119.3 kg)   SpO2 98%   BMI 38.84 kg/m     Wt Readings from Last 3 Encounters:  04/01/17  263 lb (119.3 kg)  03/20/17 267 lb (121.1 kg)  12/26/16 258 lb (117 kg)     GEN:  Well nourished, well developed in no acute distress HEENT: Normal NECK: No JVD; No carotid bruits LYMPHATICS: No lymphadenopathy CARDIAC: RRR, no murmurs, no rubs, no gallops RESPIRATORY:  Clear to auscultation without rales, wheezing or rhonchi  ABDOMEN: Soft, non-tender, non-distended MUSCULOSKELETAL:  No edema; No deformity  SKIN: Warm and dry NEUROLOGIC:  Alert and oriented x 3 PSYCHIATRIC:  Normal affect   ASSESSMENT:    1. Calcium oxalate renal stones   2. Palpitations   3. POLYCYSTIC OVARIAN DISEASE    PLAN:    In order of problems listed above:  1. Palpitations: So far we have documentation of extrasystole which is supraventricular.  I will ask her to wear Holter monitor to see how much of extrasystole she has.  She is to drink a lot of coffee but stopped it and now palpitations are better but still present.  As a part of evaluation I will ask you to have an echocardiogram done to make sure that structurally her heart is normal.  If that is the case then we will face the situation that she may need to take some medication for her symptomatology relief but otherwise it may not be necessary. 2. Polycystic ovarian disease: Followed by internal medicine team. 3. Cholesterol profile is excellent. 4. Obesity: She is working on losing weight she sign up with weight watchers previously while she was in a program she lost 20 pounds.  I wish her luck and we did talk about healthy lifestyle and exercises on the regular basis.  From my standpoint review she still can exercise.   Medication Adjustments/Labs and Tests Ordered: Current medicines are reviewed at length with the patient today.  Concerns regarding medicines are outlined above.  No orders of the defined types were placed in this encounter.  No orders of the defined types were placed in this encounter.   Signed, Georgeanna Leaobert J. Lamija Besse, MD,  [facility]. 04/01/2017 9:23 AM    [facility]

## 2017-04-01 NOTE — Patient Instructions (Signed)
Medication Instructions:  Your physician recommends that you continue on your current medications as directed. Please refer to the Current Medication list given to you today.  Labwork: None ordered  Testing/Procedures: Your physician has requested that you have an echocardiogram. Echocardiography is a painless test that uses sound waves to create images of your heart. It provides your doctor with information about the size and shape of your heart and how well your heart's chambers and valves are working. This procedure takes approximately one hour. There are no restrictions for this procedure.  Your physician has recommended that you wear a holter monitor. Holter monitors are medical devices that record the heart's electrical activity. Doctors most often use these monitors to diagnose arrhythmias. Arrhythmias are problems with the speed or rhythm of the heartbeat. The monitor is a small, portable device. You can wear one while you do your normal daily activities. This is usually used to diagnose what is causing palpitations/syncope (passing out). You will wear this for 24 hours.  EKG Today  Follow-Up: Your physician recommends that you schedule a follow-up appointment in: 1 month with Dr. Bing MatterKrasowski   Any Other Special Instructions Will Be Listed Below (If Applicable).     If you need a refill on your cardiac medications before your next appointment, please call your pharmacy.

## 2017-04-07 ENCOUNTER — Encounter: Payer: Self-pay | Admitting: Neurology

## 2017-04-07 ENCOUNTER — Ambulatory Visit: Payer: 59 | Admitting: Neurology

## 2017-04-07 VITALS — BP 110/80 | HR 106 | Ht 70.0 in | Wt 266.1 lb

## 2017-04-07 DIAGNOSIS — G5622 Lesion of ulnar nerve, left upper limb: Secondary | ICD-10-CM

## 2017-04-07 NOTE — Progress Notes (Signed)
Follow-up Visit   Date: 04/07/17    Caroline Washington MRN: [id number] DOB: 10-29-1990   Interim History: Caroline Washington is a 27 y.o. right-handed Caucasian female with PCOS and anxiety returning to the clinic for follow-up of left ulnar neuropathy.  The patient was accompanied to the clinic by self.  History of present illness: Starting in May 2018, she started having numbness over the left chest/breast region, lateral hand, and an area above the knee. She went to the ER in early Mayand was given headache cocktail which alleviated her symptoms, however they returned within the following week and since then, she has constant tingling until July/August.  For the past several years, she also as intermittent hand tingling/numbnessm, worse on the left.  Because of her sporadic symptoms, she had MRI brain to evaluate for demyelinating disease, which returned normal.  MRI cervical spine showed small disc protrusion at C6-7.  She was referred here for further evaluation and management.  Because of anxiety related to her health, she was started on zoloft.  She reports having neck tension and headaches about once every 1-2 weeks. Pain is achy and throbbing.  She has some photophobia, but no nausea or vomiting.  She usually does not treat these headaches.  Usually they are ranked as 2-6/10.  Her mother had history of migraines.   UPDATE 04/07/2017:  She is here for follow-up visit.  She started occupational therapy and uses an elbow pad for left ulnar neuropathy and has appreciated marked improvement in her paresthesias.  She denies any weakness.  She does not have any new complaints and is relieved that symptoms were not due to multiple sclerosis.   Medications:  Current Outpatient Medications on File Prior to Visit  Medication Sig Dispense Refill  . sertraline (ZOLOFT) 100 MG tablet Take 1 tablet daily by mouth.    . vitamin B-12 (CYANOCOBALAMIN) 1000 MCG tablet Take 1,000 mcg daily by mouth.      No current facility-administered medications on file prior to visit.     Allergies: No Known Allergies  Review of Systems:  CONSTITUTIONAL: No fevers, chills, night sweats, or weight loss.  EYES: No visual changes or eye pain ENT: No hearing changes.  No history of nose bleeds.   RESPIRATORY: No cough, wheezing and shortness of breath.   CARDIOVASCULAR: Negative for chest pain, and palpitations.   GI: Negative for abdominal discomfort, blood in stools or black stools.  No recent change in bowel habits.   GU:  No history of incontinence.   MUSCLOSKELETAL: No history of joint pain or swelling.  No myalgias.   SKIN: Negative for lesions, rash, and itching.   ENDOCRINE: Negative for cold or heat intolerance, polydipsia or goiter.   PSYCH:  No depression or anxiety symptoms.   NEURO: As Above.   Vital Signs:  BP 110/80   Pulse (!) 106   Ht 5\' 10"  (1.778 m)   Wt 266 lb 2 oz (120.7 kg)   BMI 38.18 kg/m  Pain Scale: 0 on a scale of 0-10   General: Well appearing, comfortable  Neurological Exam: MENTAL STATUS including orientation to time, place, person, recent and remote memory, attention span and concentration, language, and fund of knowledge is normal.  Speech is not dysarthric.  CRANIAL NERVES: Pupils equal round and reactive to light.  Normal conjugate, extra-ocular eye movements in all directions of gaze. Face is symmetric.  MOTOR:  Motor strength is 5/5 in all extremities, including intrinsic hand muscles.  SENSORY:  Intact to light touch.  COORDINATION/GAIT:   Gait narrow based and stable.   Data: NCS/EMG of the left arm 11/11/2016:  Left ulnar neuropathy with slowing across the elbow, purely demyelinating in type.  MRI brain wwo contrast 06/23/2016:  Normal MRI of the brain. No findings to suggest demyelinating disease. No acute intracranial process.  MRI cervical spine wwo contrast 06/23/2016: 1. Normal MRI appearance of the cervical spinal cord. No cord signal  abnormality to suggest demyelinating disease. 2. Central/left central disc protrusion at C6-7, abutting the ventral spinal cord. Minimal cord flattening without cord signal changes. 3. Otherwise normal MRI of the cervical spine.  Lab Results  Component Value Date   VITAMINB12 343 11/11/2016    IMPRESSION/PLAN: Left cubital tunnel syndrome, demyelinating only. Improved.   She completed OT exercises and has been compliant using an elbow pad which has significantly help.  She only gets paresthesias in the forearm occasionally and it quickly resolves.  She will continue to avoid hyperflexion across the elbow and use the elbow pad as needed.   Return to clinic as needed  The duration of this appointment visit was 15 minutes of face-to-face time with the patient.  Greater than 50% of this time was spent in counseling, explanation of diagnosis, planning of further management, and coordination of care.   Thank you for allowing me to participate in patient's care.  If I can answer any additional questions, I would be pleased to do so.    Sincerely,    Donika K. Allena KatzPatel, DO

## 2017-04-07 NOTE — Patient Instructions (Signed)
Return to clinic as needed

## 2017-04-08 ENCOUNTER — Ambulatory Visit: Payer: Self-pay | Admitting: *Deleted

## 2017-04-08 ENCOUNTER — Ambulatory Visit (HOSPITAL_COMMUNITY): Payer: 59

## 2017-04-08 NOTE — Telephone Encounter (Signed)
Pt calling with complaints of headache since 6:30-7am this morning. Pt stated she also experienced nausea and had one episode of emesis approximately a hour ago. Pt has a history of migraines but states she has not experienced one since she was 89 or 27 years old.Pt was also having complaints of a sore throat since Thursday night. Pt did not want to make an appt at this time and was given home care advice. Advised pt to try ginger-ale or Gatorade to see if she was able to tolerate drinking without having emesis before taking tylenol. Pt verbalized understanding and was told to return call to the office if home care advice did not improve.  Reason for Disposition . Headache  Answer Assessment - Initial Assessment Questions 1. LOCATION: "Where does it hurt?"      Front of head 2. ONSET: "When did the headache start?" (Minutes, hours or days)      Started  3. PATTERN: "Does the pain come and go, or has it been constant since it started?"     Throbbingn shooting behind right eye comes and goes 4. SEVERITY: "How bad is the pain?" and "What does it keep you from doing?"  (e.g., Scale 1-10; mild, moderate, or severe)   - MILD (1-3): doesn't interfere with normal activities    - MODERATE (4-7): interferes with normal activities or awakens from sleep    - SEVERE (8-10): excruciating pain, unable to do any normal activities        Pain 7 or 8 5. RECURRENT SYMPTOM: "Have you ever had headaches before?" If so, ask: "When was the last time?" and "What happened that time?"      Tension headaches in the the past 6. CAUSE: "What do you think is causing the headache?"     unsure 7. MIGRAINE: "Have you been diagnosed with migraine headaches?" If so, ask: "Is this headache similar?"      Hx of migraines when she was younger at the age of 89 or [age] 8. HEAD INJURY: "Has there been any recent injury to the head?"      No 9. OTHER SYMPTOMS: "Do you have any other symptoms?" (fever, stiff neck, eye pain, sore throat,  cold symptoms)     Sore throat, nausea and one episode of emesis 10. PREGNANCY: "Is there any chance you are pregnant?" "When was your last menstrual period?"       No, LMP-2 weeks ago  Protocols used: HEADACHE-A-AH

## 2017-04-12 NOTE — Progress Notes (Deleted)
No chief complaint on file.   HPI: Patient  Caroline Washington  27 y.o. comes in today for Preventive Health Care visit   Health Maintenance  Topic Date Due  . PAP SMEAR  07/11/2016  . TETANUS/TDAP  09/01/2019  . INFLUENZA VACCINE  Completed  . HIV Screening  Completed   Health Maintenance Review LIFESTYLE:  Exercise:   Tobacco/ETS: Alcohol:  Sugar beverages: Sleep: Drug use: no HH of  Work:    ROS:  GEN/ HEENT: No fever, significant weight changes sweats headaches vision problems hearing changes, CV/ PULM; No chest pain shortness of breath cough, syncope,edema  change in exercise tolerance. GI /GU: No adominal pain, vomiting, change in bowel habits. No blood in the stool. No significant GU symptoms. SKIN/HEME: ,no acute skin rashes suspicious lesions or bleeding. No lymphadenopathy, nodules, masses.  NEURO/ PSYCH:  No neurologic signs such as weakness numbness. No depression anxiety. IMM/ Allergy: No unusual infections.  Allergy .   REST of 12 system review negative except as per HPI   Past Medical History:  Diagnosis Date  . Calcium oxalate renal stones    If if calcium   . History of varicella   . PCO (polycystic ovaries)    hair and irrg periods ? [place]  . Urinary frequency 11/07   no cause    Past Surgical History:  Procedure Laterality Date  . TYMPANOSTOMY TUBE PLACEMENT      Family History  Problem Relation Age of Onset  . Hypertension Mother   . Depression Mother   . OCD Mother   . Bipolar disorder Mother   . Hypertension Father     Social History   Socioeconomic History  . Marital status: Single    Spouse name: Not on file  . Number of children: Not on file  . Years of education: Not on file  . Highest education level: Not on file  Social Needs  . Financial resource strain: Not on file  . Food insecurity - worry: Not on file  . Food insecurity - inability: Not on file  . Transportation needs - medical: Not on file  . Transportation needs  - non-medical: Not on file  Occupational History  . Not on file  Tobacco Use  . Smoking status: Never Smoker  . Smokeless tobacco: Never Used  Substance and Sexual Activity  . Alcohol use: No  . Drug use: No  . Sexual activity: Not on file  Other Topics Concern  . Not on file  Social History Narrative   Father 8'10'   Mother 5'9"   Caffeine: Coke twice weekly and coffee everyday.   HH of 3   2 cats and 3 dogs   Parents: [name] and Rayvon Char   Sleep 7 hours      [employer] .     Graduated recently from [employer]ines[employer]   Looking into PT school    Outpatient Medications Prior to Visit  Medication Sig Dispense Refill  . sertraline (ZOLOFT) 100 MG tablet Take 1 tablet daily by mouth.    . vitamin B-12 (CYANOCOBALAMIN) 1000 MCG tablet Take 1,000 mcg daily by mouth.     No facility-administered medications prior to visit.      EXAM:  There were no vitals taken for this visit.  There is no height or weight on file to calculate BMI. Wt Readings from Last 3 Encounters:  04/07/17 266 lb 2 oz (120.7 kg)  04/01/17 263 lb (119.3 kg)  03/20/17 267 lb (121.1 kg)    Physical Exam: Vital signs reviewed RUE:AVWUGEN:This is a well-developed well-nourished alert cooperative    who appearsr stated age in no acute distress.  HEENT: normocephalic atraumatic , Eyes: PERRL EOM's full, conjunctiva clear, Nares: paten,t no deformity discharge or tenderness., Ears: no deformity EAC's clear TMs with normal landmarks. Mouth: clear OP, no lesions, edema.  Moist mucous membranes. Dentition in adequate repair. NECK: supple without masses, thyromegaly or bruits. CHEST/PULM:  Clear to auscultation and percussion breath sounds equal no wheeze , rales or rhonchi. No chest wall deformities or tenderness. Breast: normal by inspection . No dimpling, discharge, masses, tenderness or discharge . CV: PMI is nondisplaced, S1 S2 no gallops, murmurs, rubs. Peripheral pulses are full without delay.No JVD .    ABDOMEN: Bowel sounds normal nontender  No guard or rebound, no hepato splenomegal no CVA tenderness.  No hernia. Extremtities:  No clubbing cyanosis or edema, no acute joint swelling or redness no focal atrophy NEURO:  Oriented x3, cranial nerves 3-12 appear to be intact, no obvious focal weakness,gait within normal limits no abnormal reflexes or asymmetrical SKIN: No acute rashes normal turgor, color, no bruising or petechiae. PSYCH: Oriented, good eye contact, no obvious depression anxiety, cognition and judgment appear normal. LN: no cervical axillary inguinal adenopathy  Lab Results  Component Value Date   WBC 8.3 09/30/2016   HGB 12.6 09/30/2016   HCT 39.1 06/13/2016   PLT 205 09/30/2016   GLUCOSE 106 (H) 06/13/2016   CHOL 149 11/02/2014   TRIG 77.0 11/02/2014   HDL 54.10 11/02/2014   LDLCALC 79 11/02/2014   ALT 14 06/13/2016   AST 17 06/13/2016   NA 141 06/13/2016   K 3.4 (L) 06/13/2016   CL 107 06/13/2016   CREATININE 0.69 06/13/2016   BUN 15 09/30/2016   CO2 25 06/13/2016   TSH 0.86 11/11/2016    BP Readings from Last 3 Encounters:  04/07/17 110/80  04/01/17 100/78  03/20/17 118/82    Lab results reviewed with patient   ASSESSMENT AND PLAN:  Discussed the following assessment and plan:  Visit for preventive health examination  Patient Care Team: Znya Albino, Neta MendsWanda K, MD as PCP - General Mezer, Dimas AguasHoward, MD (Obstetrics and Gynecology) dermatology Lenda KelpHudnall, Shane R, MD as Consulting Physician (Sports Medicine) Jethro Bolusannenbaum, Sigmund, MD as Consulting Physician (Urology) There are no Patient Instructions on file for this visit.  Neta MendsWanda K. Torres Hardenbrook M.D.

## 2017-04-17 ENCOUNTER — Encounter: Payer: 59 | Admitting: Internal Medicine

## 2017-04-22 ENCOUNTER — Ambulatory Visit (HOSPITAL_COMMUNITY)
Admission: RE | Admit: 2017-04-22 | Discharge: 2017-04-22 | Disposition: A | Discharge status: Home / Self Care | Payer: 59 | Source: Ambulatory Visit | Attending: Cardiology | Admitting: Cardiology

## 2017-04-22 ENCOUNTER — Ambulatory Visit: Payer: 59

## 2017-04-22 DIAGNOSIS — I358 Other nonrheumatic aortic valve disorders: Secondary | ICD-10-CM | POA: Insufficient documentation

## 2017-04-22 DIAGNOSIS — E282 Polycystic ovarian syndrome: Secondary | ICD-10-CM | POA: Diagnosis not present

## 2017-04-22 DIAGNOSIS — Z87442 Personal history of urinary calculi: Secondary | ICD-10-CM | POA: Insufficient documentation

## 2017-04-22 DIAGNOSIS — N200 Calculus of kidney: Secondary | ICD-10-CM | POA: Diagnosis not present

## 2017-04-22 DIAGNOSIS — R002 Palpitations: Secondary | ICD-10-CM | POA: Diagnosis not present

## 2017-04-22 NOTE — Progress Notes (Signed)
  Echocardiogram 2D Echocardiogram has been performed.  Celene SkeenVijay  Chaysen Tillman 04/22/2017, 9:52 AM

## 2017-04-27 ENCOUNTER — Other Ambulatory Visit (HOSPITAL_BASED_OUTPATIENT_CLINIC_OR_DEPARTMENT_OTHER): Payer: 59

## 2017-04-29 ENCOUNTER — Encounter: Payer: Self-pay | Admitting: *Deleted

## 2017-04-29 ENCOUNTER — Other Ambulatory Visit: Payer: Self-pay | Admitting: Otolaryngology

## 2017-04-29 ENCOUNTER — Ambulatory Visit: Payer: 59 | Admitting: Cardiology

## 2017-05-12 ENCOUNTER — Other Ambulatory Visit: Payer: Self-pay | Admitting: Cardiology

## 2017-05-12 ENCOUNTER — Ambulatory Visit (INDEPENDENT_AMBULATORY_CARE_PROVIDER_SITE_OTHER): Payer: 59

## 2017-05-12 DIAGNOSIS — E282 Polycystic ovarian syndrome: Secondary | ICD-10-CM

## 2017-05-12 DIAGNOSIS — R002 Palpitations: Secondary | ICD-10-CM

## 2017-05-12 DIAGNOSIS — N200 Calculus of kidney: Secondary | ICD-10-CM

## 2017-05-13 ENCOUNTER — Telehealth: Payer: Self-pay | Admitting: Cardiology

## 2017-05-13 NOTE — Telephone Encounter (Signed)
Patient was given electrodes for sensitive skin.  Monitor was due to come off at 1600-1630 so her 24 hour holter monitor will be short of approximately 4 hours data.

## 2017-05-13 NOTE — Telephone Encounter (Signed)
New message  Patient mother calling to infom nurse, daughter is taking monitor off today. States her daughter is allergic to electrode pads

## 2017-05-19 ENCOUNTER — Telehealth: Payer: Self-pay | Admitting: Internal Medicine

## 2017-05-19 NOTE — Telephone Encounter (Signed)
Patient needs to have a Hep B Titer done for school.  It was her mom's understanding that she has to get Dr Rosezella FloridaPanosh's approval to get this done.  Mom is requesting a call back.

## 2017-05-19 NOTE — Telephone Encounter (Signed)
disregard

## 2017-05-20 ENCOUNTER — Ambulatory Visit: Payer: 59 | Admitting: Cardiology

## 2017-05-21 ENCOUNTER — Encounter: Payer: Self-pay | Admitting: Cardiology

## 2017-05-21 ENCOUNTER — Ambulatory Visit: Payer: 59 | Admitting: Cardiology

## 2017-05-21 VITALS — BP 120/62 | HR 84 | Ht 69.0 in | Wt 273.8 lb

## 2017-05-21 DIAGNOSIS — Z6841 Body Mass Index (BMI) 40.0 and over, adult: Secondary | ICD-10-CM | POA: Diagnosis not present

## 2017-05-21 DIAGNOSIS — R002 Palpitations: Secondary | ICD-10-CM | POA: Diagnosis not present

## 2017-05-21 NOTE — Progress Notes (Signed)
Cardiology Office Note:    Date:  05/21/2017   ID:  Caroline Washington, DOB July 25, 1990, MRN [id number]  PCP:  Madelin Headings, MD  Cardiologist:  Gypsy Balsam, MD    Referring MD: Madelin Headings, MD   Chief Complaint  Patient presents with  . 1 month follow up  Doing well  History of Present Illness:    Caroline Washington is a 27 y.o. female who was originally seen for palpitations.  She described palpitations skipped beats.  Quite extensive evaluation has been done which included echocardiogram which was normal as well as Holter monitor which showed only supraventricular ectopy without sustained arrhythmia noted pairs.  While she is doing well but dictations are much better.  She stopped drinking caffeinated drinks which helps significantly she also will try to stay well-hydrated all this measures appears to be helping.  Past Medical History:  Diagnosis Date  . Calcium oxalate renal stones    If if calcium   . History of varicella   . PCO (polycystic ovaries)    hair and irrg periods ? [place]  . Urinary frequency 11/07   no cause    Past Surgical History:  Procedure Laterality Date  . TYMPANOSTOMY TUBE PLACEMENT      Current Medications: Current Meds  Medication Sig  . sertraline (ZOLOFT) 100 MG tablet Take 1 tablet daily by mouth.  . vitamin B-12 (CYANOCOBALAMIN) 1000 MCG tablet Take 1,000 mcg daily by mouth.     Allergies:   Patient has no known allergies.   Social History   Socioeconomic History  . Marital status: Single    Spouse name: Not on file  . Number of children: Not on file  . Years of education: Not on file  . Highest education level: Not on file  Occupational History  . Not on file  Social Needs  . Financial resource strain: Not on file  . Food insecurity:    Worry: Not on file    Inability: Not on file  . Transportation needs:    Medical: Not on file    Non-medical: Not on file  Tobacco Use  . Smoking status: Never Smoker  . Smokeless tobacco:  Never Used  Substance and Sexual Activity  . Alcohol use: No  . Drug use: No  . Sexual activity: Not on file  Lifestyle  . Physical activity:    Days per week: Not on file    Minutes per session: Not on file  . Stress: Not on file  Relationships  . Social connections:    Talks on phone: Not on file    Gets together: Not on file    Attends religious service: Not on file    Active member of club or organization: Not on file    Attends meetings of clubs or organizations: Not on file    Relationship status: Not on file  Other Topics Concern  . Not on file  Social History Narrative   Father 32'10'   Mother 5'9"   Caffeine: Coke twice weekly and coffee everyday.   HH of 3   2 cats and 3 dogs   Parents: [name] and Rayvon Char   Sleep 7 hours      [employer] .     Graduated recently from [employer]ines[employer]   Looking into PT school     Family History: The patient's family history includes Bipolar disorder in her mother; Depression in her mother; Hypertension in her father and  mother; OCD in her mother. ROS:   Please see the history of present illness.    All 14 point review of systems negative except as described per history of present illness  EKGs/Labs/Other Studies Reviewed:      Recent Labs: 06/13/2016: ALT 14; Creatinine, Ser 0.69; Magnesium 1.8; Potassium 3.4; Sodium 141 09/30/2016: BUN 15; Hemoglobin 12.6; Platelets 205 11/11/2016: TSH 0.86  Recent Lipid Panel    Component Value Date/Time   CHOL 149 11/02/2014 0913   TRIG 77.0 11/02/2014 0913   TRIG 72 12/22/2005 0952   HDL 54.10 11/02/2014 0913   CHOLHDL 3 11/02/2014 0913   VLDL 15.4 11/02/2014 0913   LDLCALC 79 11/02/2014 0913    Physical Exam:    VS:  BP 120/62   Pulse 84   Ht 5\' 9"  (1.753 m)   Wt 273 lb 12.8 oz (124.2 kg)   SpO2 98%   BMI 40.43 kg/m     Wt Readings from Last 3 Encounters:  05/21/17 273 lb 12.8 oz (124.2 kg)  04/07/17 266 lb 2 oz (120.7 kg)  04/01/17 263 lb (119.3 kg)      GEN:  Well nourished, well developed in no acute distress HEENT: Normal NECK: No JVD; No carotid bruits LYMPHATICS: No lymphadenopathy CARDIAC: RRR, no murmurs, no rubs, no gallops RESPIRATORY:  Clear to auscultation without rales, wheezing or rhonchi  ABDOMEN: Soft, non-tender, non-distended MUSCULOSKELETAL:  No edema; No deformity  SKIN: Warm and dry LOWER EXTREMITIES: no swelling NEUROLOGIC:  Alert and oriented x 3 PSYCHIATRIC:  Normal affect   ASSESSMENT:    1. Palpitations   2. Class 3 severe obesity due to excess calories without serious comorbidity with body mass index (BMI) of 40.0 to 44.9 in adult [facility](HCC)    PLAN:    In order of problems listed above:  1. Palpitations: Much better we did talk about potentially using some beta-blocker but she is not interested I think it is right with her minor frequency of those extra beats I would simply prefer conservative approach. 2. Obesity obviously a problem.  We did talk about diet exercises on the regular basis she promised to comply with this.  Her grandmother was diagnosed with glioblastoma and she was nervous about this and ate more and she understands the problem try to change this habit.   Medication Adjustments/Labs and Tests Ordered: Current medicines are reviewed at length with the patient today.  Concerns regarding medicines are outlined above.  No orders of the defined types were placed in this encounter.  Medication changes: No orders of the defined types were placed in this encounter.   Signed, Georgeanna Leaobert J. Krasowski, MD, [facility] Hospital[facility]ACC 05/21/2017 4:10 PM    [facility]

## 2017-05-21 NOTE — Patient Instructions (Signed)

## 2017-05-25 NOTE — Telephone Encounter (Signed)
Ok to do the hep  b titer  QUANTITATIVE  Make sure she doenst require other blood tests such as  Varicella titer  Or   Another Tb quantiferon .

## 2017-05-25 NOTE — Telephone Encounter (Signed)
Pt is requesting a HEP B titer/booster done for school.  Pt had Heb A/B combo on 08/31/09  Please advise Dr Fabian SharpPanosh, thanks.

## 2017-05-26 NOTE — Telephone Encounter (Signed)
Called Caroline PeltonSharon Washington, aware of below per Dr Fabian SharpPanosh, Mother will call back once she talks to the patient and will let us know if she needs anything in addition to the Hep B Titer. Will schedule lab visit and order labs when she calls back.

## 2017-10-22 ENCOUNTER — Other Ambulatory Visit: Payer: Self-pay | Admitting: Obstetrics and Gynecology

## 2017-10-22 DIAGNOSIS — N6310 Unspecified lump in the right breast, unspecified quadrant: Secondary | ICD-10-CM

## 2017-10-29 ENCOUNTER — Ambulatory Visit
Admission: RE | Admit: 2017-10-29 | Discharge: 2017-10-29 | Disposition: A | Discharge status: Home / Self Care | Payer: No Typology Code available for payment source | Source: Ambulatory Visit | Attending: Obstetrics and Gynecology | Admitting: Obstetrics and Gynecology

## 2017-10-29 DIAGNOSIS — N6310 Unspecified lump in the right breast, unspecified quadrant: Secondary | ICD-10-CM

## 2017-12-10 ENCOUNTER — Telehealth: Payer: Self-pay | Admitting: Internal Medicine

## 2017-12-10 NOTE — Telephone Encounter (Signed)
Copied from [place] [phone]. Topic: General - Other >> Dec 10, 2017  4:42 PM Cecelia Byars, NT wrote: Reason for CRM: Patients school has an out break of mumps ,her mom called and would like to know the price of the MMR  and if the patient can get it please call her at [phone]

## 2017-12-11 NOTE — Telephone Encounter (Signed)
Can get a booster   mmr     Please let her know the charge   ( may be covered  by insurance)

## 2017-12-11 NOTE — Telephone Encounter (Signed)
Spoke with mom (DPR) and informed her that the cost is $130 for MMR vaccine and $49 administration fee.  Patient will call back to schedule a nurse visit.

## 2017-12-25 ENCOUNTER — Telehealth: Payer: Self-pay | Admitting: *Deleted

## 2017-12-25 NOTE — Telephone Encounter (Signed)
Pt completed series of MMR per [facility] MMR  12/17/1992    1 of 2          09/01/1996    2 of 2

## 2017-12-25 NOTE — Telephone Encounter (Signed)
Copied from [place] [id number]. Topic: General - Other >> Dec 23, 2017  2:41 PM Keene Breath wrote: Reason for CRM: Patient's mom called to request that patient receive an MMR vaccine.  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  Would like to know which vaccine the patient will need in this case.  Please advise.  CB# for mother is [phone]

## 2017-12-25 NOTE — Telephone Encounter (Signed)
  She has  Had  Her 2 immunizations as  Recommended    But ok to get a booster if thinks she may have been exposed  Or as per the public health  Team  Advising .   Would do the MMR because that is the only one  Available to [place]  Can come in for this  injection only .   She maybe  can get this at their student  health center   Quicker

## 2017-12-25 NOTE — Telephone Encounter (Signed)
Patients mom states she will not be getting MMR shot in office

## 2017-12-25 NOTE — Telephone Encounter (Signed)
Spoke with Mother, states that they are going to take her the [facility] for the MMR d/t her insurance. Nothing further needed.

## 2018-03-11 ENCOUNTER — Other Ambulatory Visit: Payer: Self-pay

## 2018-03-11 ENCOUNTER — Encounter (HOSPITAL_COMMUNITY): Payer: Self-pay

## 2018-03-11 ENCOUNTER — Emergency Department (HOSPITAL_COMMUNITY)
Admission: EM | Admit: 2018-03-11 | Discharge: 2018-03-11 | Disposition: A | Discharge status: Home / Self Care | Payer: PRIVATE HEALTH INSURANCE | Attending: Emergency Medicine | Admitting: Emergency Medicine

## 2018-03-11 DIAGNOSIS — Z79899 Other long term (current) drug therapy: Secondary | ICD-10-CM | POA: Insufficient documentation

## 2018-03-11 DIAGNOSIS — R1032 Left lower quadrant pain: Secondary | ICD-10-CM | POA: Diagnosis present

## 2018-03-11 DIAGNOSIS — Z87442 Personal history of urinary calculi: Secondary | ICD-10-CM | POA: Diagnosis not present

## 2018-03-11 DIAGNOSIS — R109 Unspecified abdominal pain: Secondary | ICD-10-CM

## 2018-03-11 LAB — CBC WITH DIFFERENTIAL/PLATELET
Abs Immature Granulocytes: 0.03 10*3/uL (ref 0.00–0.07)
BASOS PCT: 1 %
Basophils Absolute: 0 10*3/uL (ref 0.0–0.1)
Eosinophils Absolute: 0.1 10*3/uL (ref 0.0–0.5)
Eosinophils Relative: 1 %
HCT: 40.7 % (ref 36.0–46.0)
Hemoglobin: 12.5 g/dL (ref 12.0–15.0)
Immature Granulocytes: 1 %
Lymphocytes Relative: 29 %
Lymphs Abs: 1.9 10*3/uL (ref 0.7–4.0)
MCH: 25.9 pg — ABNORMAL LOW (ref 26.0–34.0)
MCHC: 30.7 g/dL (ref 30.0–36.0)
MCV: 84.3 fL (ref 80.0–100.0)
MONOS PCT: 7 %
Monocytes Absolute: 0.5 10*3/uL (ref 0.1–1.0)
Neutro Abs: 4.1 10*3/uL (ref 1.7–7.7)
Neutrophils Relative %: 61 %
Platelets: 243 10*3/uL (ref 150–400)
RBC: 4.83 MIL/uL (ref 3.87–5.11)
RDW: 14.4 % (ref 11.5–15.5)
WBC: 6.6 10*3/uL (ref 4.0–10.5)
nRBC: 0 % (ref 0.0–0.2)

## 2018-03-11 LAB — I-STAT BETA HCG BLOOD, ED (MC, WL, AP ONLY): I-stat hCG, quantitative: <5 m[IU]/mL (ref <5)

## 2018-03-11 LAB — URINALYSIS, ROUTINE W REFLEX MICROSCOPIC
Bilirubin Urine: NEGATIVE
GLUCOSE, UA: NEGATIVE mg/dL
KETONES UR: NEGATIVE mg/dL
Leukocytes, UA: NEGATIVE
NITRITE: NEGATIVE
PH: 7 (ref 5.0–8.0)
Protein, ur: NEGATIVE mg/dL
Specific Gravity, Urine: 1.018 (ref 1.005–1.030)

## 2018-03-11 LAB — BASIC METABOLIC PANEL
Anion gap: 6 (ref 5–15)
BUN: 12 mg/dL (ref 6–20)
CO2: 26 mmol/L (ref 22–32)
Calcium: 8.8 mg/dL — ABNORMAL LOW (ref 8.9–10.3)
Chloride: 104 mmol/L (ref 98–111)
Creatinine, Ser: 0.64 mg/dL (ref 0.44–1.00)
GFR calc Af Amer: >60 mL/min (ref >60)
GLUCOSE: 98 mg/dL (ref 70–99)
Potassium: 4 mmol/L (ref 3.5–5.1)
Sodium: 136 mmol/L (ref 135–145)

## 2018-03-11 MED ORDER — IBUPROFEN 200 MG PO TABS
600.0000 mg | ORAL_TABLET | Freq: Once | ORAL | Status: AC
  Administered 2018-03-11: 600 mg via ORAL
  Filled 2018-03-11: qty 3

## 2018-03-11 MED ORDER — IBUPROFEN 600 MG PO TABS: 600.0000 mg | ORAL_TABLET | Freq: Four times a day (QID) | ORAL | 0 refills | Status: DC | PRN

## 2018-03-11 MED ORDER — HYDROCODONE-ACETAMINOPHEN 5-325 MG PO TABS
1.0000 | ORAL_TABLET | Freq: Once | ORAL | Status: AC
  Administered 2018-03-11: 1 via ORAL
  Filled 2018-03-11: qty 1

## 2018-03-11 MED ORDER — HYDROCODONE-ACETAMINOPHEN 5-325 MG PO TABS: 1.0000 | ORAL_TABLET | ORAL | 0 refills | Status: DC | PRN

## 2018-03-11 MED ORDER — ONDANSETRON 4 MG PO TBDP: 4.0000 mg | ORAL_TABLET | Freq: Three times a day (TID) | ORAL | 0 refills | Status: DC | PRN

## 2018-03-11 NOTE — Discharge Instructions (Signed)
Take Ibuprofen 600mg  for pain Take Norco for severe pain Take Zofran for nausea/vomiting Please return if worsening or follow up with your urologist

## 2018-03-11 NOTE — ED Triage Notes (Signed)
Patient c/o left flank pain since this AM. patient has a history of kidney stones.

## 2018-03-11 NOTE — ED Provider Notes (Signed)
[facility]-EMERGENCY DEPT Provider Note   CSN: [id number] Arrival date & time: 03/11/18  0707     History   Chief Complaint Chief Complaint  Patient presents with  . Flank Pain    HPI Caroline Washington is a 28 y.o. female who presents with left sided flank pain.  Past medical history significant for history of kidney stones, PCOS.  The patient states that for the past couple of days she has had an achy pain over her left flank.  She states she gets kidney stones frequently and it felt similar when she usually deals with them on her own.  She does see a urologist Dr. Patsi Sears.  This morning she started to have severe sharp left flank pain which radiated to the left groin.  She reports associated nausea and vomiting and dysuria.  She has been constipated recently as well and does not know if that is exacerbating the pain.  She denies fever, chills, chest pain, shortness of breath, cough, diarrhea, vaginal discharge or bleeding.  Since she has been in the emergency department her pain has improved on its own.  She denies any recent urologic procedures or abdominal surgeries.  HPI  Past Medical History:  Diagnosis Date  . Calcium oxalate renal stones    If if calcium   . History of varicella   . PCO (polycystic ovaries)    hair and irrg periods ? [place]  . Urinary frequency 11/07   no cause    Patient Active Problem List   Diagnosis Date Noted  . Palpitations 04/01/2017  . PCOS (polycystic ovarian syndrome) 07/31/2014  . Hx of urinary stone 07/31/2014  . Eyelid inflammation 01/14/2011  . Visit for preventive health examination 01/14/2011  . Folliculitis 10/08/2010  . Heel pain 05/20/2010  . Calcium oxalate renal stones   . Renal stone 04/18/2010  . OBESITY 08/17/2009  . POLYCYSTIC OVARIAN DISEASE 08/28/2008  . FREQUENCY, URINARY 08/25/2008  . CHICKENPOX, HX OF 08/25/2008    Past Surgical History:  Procedure Laterality Date  . TONSILLECTOMY    .  TYMPANOSTOMY TUBE PLACEMENT       OB History    Gravida  0   Para  0   Term  0   Preterm  0   AB  0   Living  0     SAB  0   TAB  0   Ectopic  0   Multiple  0   Live Births               Home Medications    Prior to Admission medications   Medication Sig Start Date End Date Taking? Authorizing Provider  acetaminophen (TYLENOL) 325 MG tablet Take 650 mg by mouth every 6 (six) hours as needed for mild pain, fever or headache.   Yes [provider]  sertraline (ZOLOFT) 100 MG tablet Take 1 tablet daily by mouth. 11/17/16  Yes [provider]    Family History Family History  Problem Relation Age of Onset  . Hypertension Mother   . Depression Mother   . OCD Mother   . Bipolar disorder Mother   . Hypertension Father   . Breast cancer Maternal Grandmother     Social History Social History   Tobacco Use  . Smoking status: Never Smoker  . Smokeless tobacco: Never Used  Substance Use Topics  . Alcohol use: No  . Drug use: No     Allergies   Other  Review of Systems Review of Systems  Constitutional: Negative for fever.  Respiratory: Negative for cough and shortness of breath.   Cardiovascular: Negative for chest pain.  Gastrointestinal: Positive for constipation, nausea and vomiting. Negative for abdominal pain and diarrhea.  Genitourinary: Positive for dysuria and flank pain. Negative for difficulty urinating, hematuria, pelvic pain, vaginal bleeding and vaginal discharge.  All other systems reviewed and are negative.    Physical Exam Updated Vital Signs BP 133/80 (BP Location: Left Arm)   Pulse 72   Temp 98.4 F (36.9 C) (Oral)   Resp 14   Ht 5\' 9"  (1.753 m)   Wt 122.5 kg   LMP 02/23/2018 (Approximate)   SpO2 100%   BMI 39.87 kg/m   Physical Exam Vitals signs and nursing note reviewed.  Constitutional:      General: She is not in acute distress.    Appearance: She is well-developed. She is obese.      Comments: Calm and cooperative  HENT:     Head: Normocephalic and atraumatic.  Eyes:     General: No scleral icterus.       Right eye: No discharge.        Left eye: No discharge.     Conjunctiva/sclera: Conjunctivae normal.     Pupils: Pupils are equal, round, and reactive to light.  Neck:     Musculoskeletal: Normal range of motion.  Cardiovascular:     Rate and Rhythm: Normal rate and regular rhythm.  Pulmonary:     Effort: Pulmonary effort is normal. No respiratory distress.     Breath sounds: Normal breath sounds.  Abdominal:     General: There is no distension.     Palpations: Abdomen is soft.     Tenderness: There is abdominal tenderness (Mild left-sided CVA tenderness and mild left lower quadrant tenderness).  Skin:    General: Skin is warm and dry.  Neurological:     Mental Status: She is alert and oriented to person, place, and time.  Psychiatric:        Behavior: Behavior normal.      ED Treatments / Results  Labs (all labs ordered are listed, but only abnormal results are displayed) Labs Reviewed  URINALYSIS, ROUTINE W REFLEX MICROSCOPIC - Abnormal; Notable for the following components:      Result Value   Hgb urine dipstick SMALL (*)    Bacteria, UA RARE (*)    All other components within normal limits  BASIC METABOLIC PANEL - Abnormal; Notable for the following components:   Calcium 8.8 (*)    All other components within normal limits  CBC WITH DIFFERENTIAL/PLATELET - Abnormal; Notable for the following components:   MCH 25.9 (*)    All other components within normal limits  I-STAT BETA HCG BLOOD, ED ([facility], [facility], [facility] ONLY)    EKG None  Radiology No results found.  Procedures Procedures (including critical care time)  Medications Ordered in ED Medications  ibuprofen (ADVIL,MOTRIN) tablet 600 mg (600 mg Oral Given 03/11/18 [id number])  HYDROcodone-acetaminophen (NORCO/VICODIN) 5-325 MG per tablet 1 tablet (1 tablet Oral Given 03/11/18 [id number])     Initial  Impression / Assessment and Plan / ED Course  I have reviewed the triage vital signs and the nursing notes.  Pertinent labs & imaging results that were available during my care of the patient were reviewed by me and considered in my medical decision making (see chart for details).  28 year old female with recurrent kidney stones presents with left flank  pain.  Her vital signs are normal.  She states the pain was severe earlier but has eased off since she has been in the emergency department.  She has mild left flank tenderness on exam.  Shared decision making was made.  She is comfortable with foregoing any imaging today since this is a recurrent problem and feels similar to prior stones.  Will check blood work and urine and provide pain control here.  CBC, BMP are normal.  UA shows small amount of hemoglobin.  No signs of infection.  She is tolerated p.o. and pain is improved.  Will be given prescription for pain medicine and Zofran and have her follow-up with urology as needed.  She was given return precautions.  Final Clinical Impressions(s) / ED Diagnoses   Final diagnoses:  Left flank pain    ED Discharge Orders    None       Bethel Born, PA-C 03/11/18 [id number]    Gerhard Munch, MD 03/11/18 [phone]

## 2018-11-18 ENCOUNTER — Telehealth: Payer: Self-pay

## 2018-11-18 ENCOUNTER — Other Ambulatory Visit: Payer: Self-pay

## 2018-11-18 DIAGNOSIS — Z111 Encounter for screening for respiratory tuberculosis: Secondary | ICD-10-CM

## 2018-11-18 NOTE — Telephone Encounter (Signed)
So  Ok to order testing   For quantiferon.   But    Why does she need this   Is there a form?often other   Actions required .   She is due for OV  Or  cpx or  Yearly check  . Or update

## 2018-11-18 NOTE — Telephone Encounter (Signed)
Copied from [place] [phone]. Topic: Appointment Scheduling - Scheduling Inquiry for Clinic >> Nov 18, 2018 10:50 AM Scherrie Gerlach wrote: Reason for CRM: pt needs an order for the TB Quantiferon  lab draw, would like to do tomorrow.  Pt needs for her job. However, pt has not been seen since 03/2017.

## 2018-11-18 NOTE — Telephone Encounter (Signed)
Sent messag evia mychart and told pt to schedule an office visit with [place] Pt does not have form just needs tb test

## 2018-11-18 NOTE — Telephone Encounter (Signed)
Please advise 

## 2018-11-19 ENCOUNTER — Other Ambulatory Visit (INDEPENDENT_AMBULATORY_CARE_PROVIDER_SITE_OTHER): Payer: PRIVATE HEALTH INSURANCE

## 2018-11-19 ENCOUNTER — Other Ambulatory Visit: Payer: Self-pay

## 2018-11-19 DIAGNOSIS — Z111 Encounter for screening for respiratory tuberculosis: Secondary | ICD-10-CM | POA: Diagnosis not present

## 2018-11-21 LAB — QUANTIFERON-TB GOLD PLUS
Mitogen-NIL: >10 IU/mL
NIL: 0.05 IU/mL
QuantiFERON-TB Gold Plus: NEGATIVE
TB1-NIL: <0 IU/mL
TB2-NIL: 0.01 IU/mL

## 2019-01-07 IMAGING — US ULTRASOUND RIGHT BREAST LIMITED
1 series · 3 of 3 positions shown · non-contrast
Comparison: None.

CLINICAL DATA: 27-year-old female presenting for evaluation of a
palpable lump in the inferior right breast.

EXAM:
ULTRASOUND OF THE RIGHT BREAST

[Series 1: ultrasound right breast limited · 0.07mm/px · 3 of 3 slices shown]
[im 1/3]
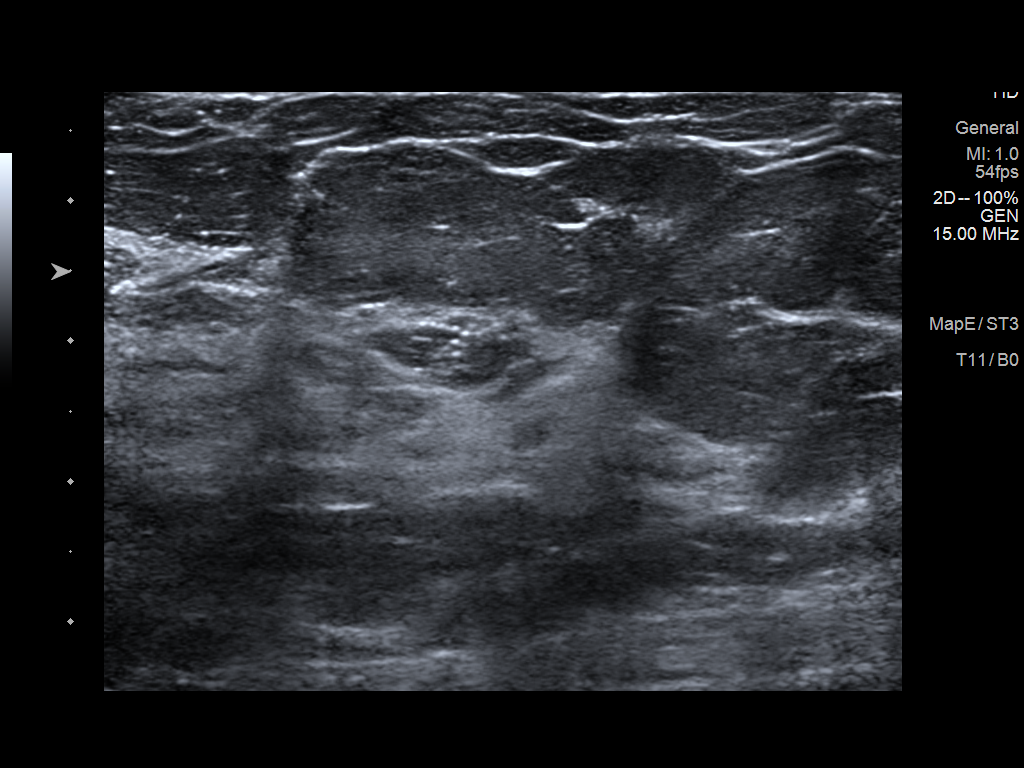
[im 2/3]
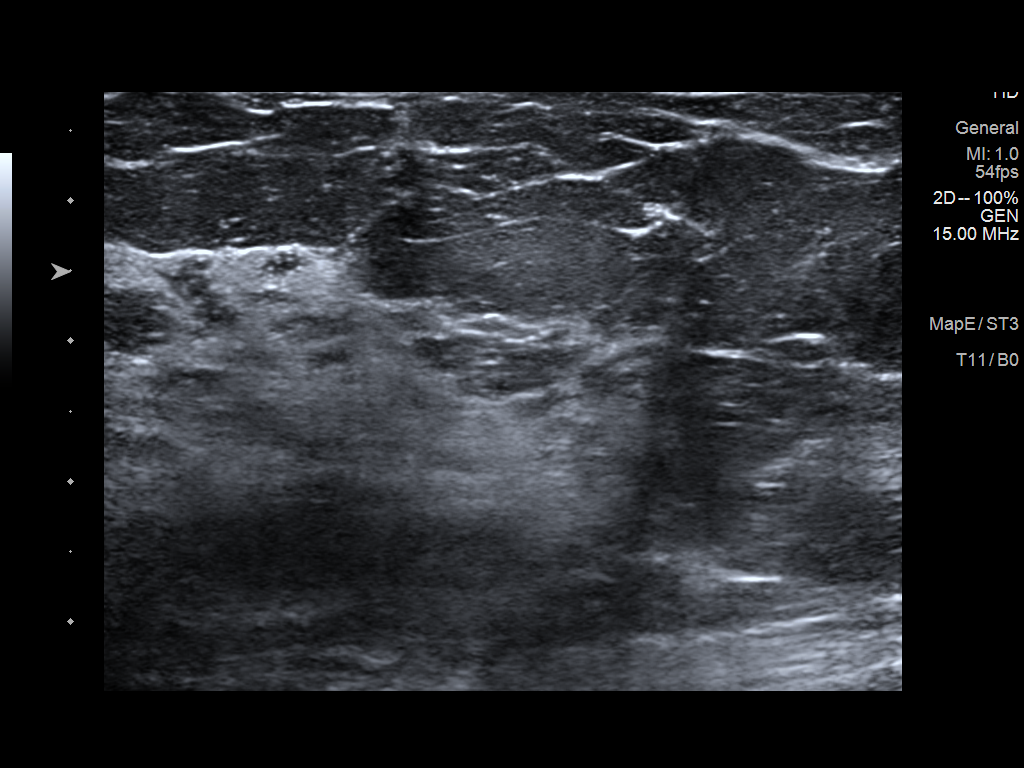
[im 3/3]
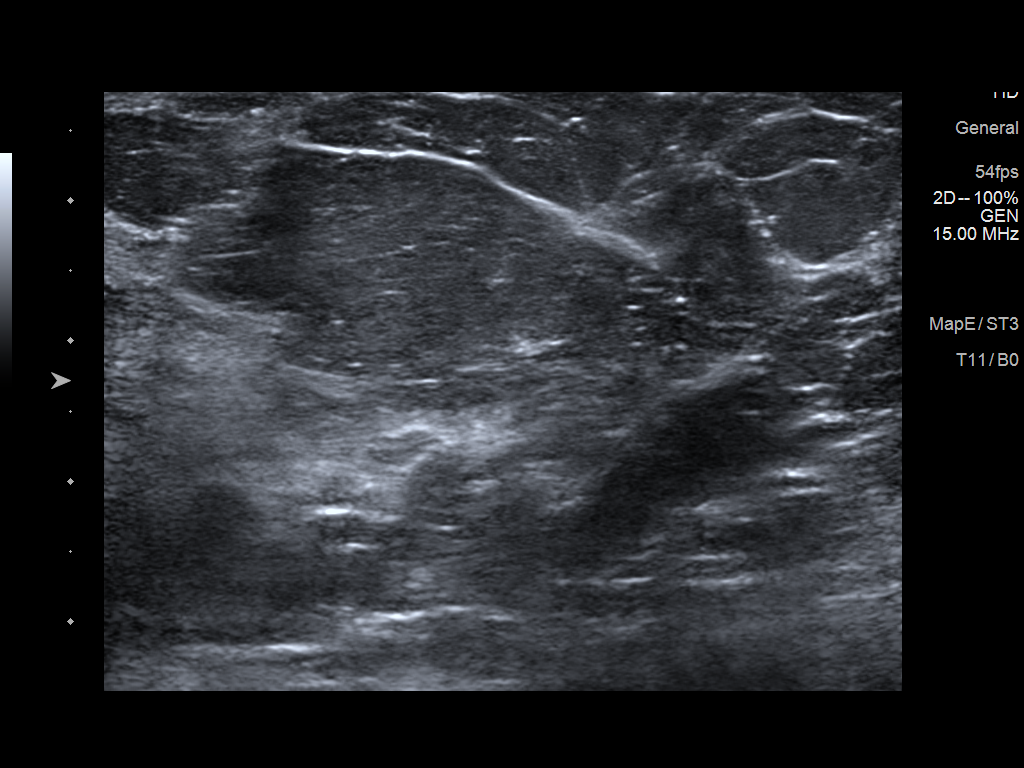

[3 of 3 positions shown; findings below may reference images not displayed]

FINDINGS: On physical exam, there is a mobile ridge of tissue in the right
breast just inferior to the nipple.

Targeted ultrasound is performed, showing normal dense
fibroglandular tissue in the lower outer right breast. No masses or
suspicious areas of shadowing are identified.
IMPRESSION: There is no targeted sonographic abnormality at the palpable site of
concern in the inferior right breast.

RECOMMENDATION:
1. Clinical follow-up recommended for the palpable area of concern
in the right breast. Any further workup should be based on clinical
grounds.

2. Screening mammogram at age 41 unless there are persistent or
intervening clinical concerns. (Code:[id number]-R-7M5)

I have discussed the findings and recommendations with the patient.
Results were also provided in writing at the conclusion of the
visit. If applicable, a reminder letter will be sent to the patient
regarding the next appointment.

BI-RADS CATEGORY  1: Negative.

## 2019-01-23 ENCOUNTER — Ambulatory Visit
Admission: EM | Admit: 2019-01-23 | Discharge: 2019-01-23 | Disposition: A | Discharge status: Home / Self Care | Payer: Commercial Managed Care - PPO | Attending: Physician Assistant | Admitting: Physician Assistant

## 2019-01-23 ENCOUNTER — Encounter: Payer: Self-pay | Admitting: Emergency Medicine

## 2019-01-23 ENCOUNTER — Other Ambulatory Visit: Payer: Self-pay

## 2019-01-23 DIAGNOSIS — R21 Rash and other nonspecific skin eruption: Secondary | ICD-10-CM | POA: Diagnosis not present

## 2019-01-23 MED ORDER — CLOTRIMAZOLE-BETAMETHASONE 1-0.05 % EX CREA: TOPICAL_CREAM | CUTANEOUS | 0 refills | Status: DC

## 2019-01-23 NOTE — Discharge Instructions (Signed)
Lotrisone as directed. Keep area clean and dry. Monitor for spreading redness, warmth, pain, fever, follow up for reevaluation needed.

## 2019-01-23 NOTE — ED Provider Notes (Signed)
[facility]    CSN: [id number] Arrival date & time: 01/23/19  7829      History   Chief Complaint Chief Complaint  Patient presents with  . Rash    HPI RUTHETTA KOOPMANN is a 28 y.o. female.   28 year old female comes in for 1 week history of rash to the umbilicus.  Patient states has had similar rash in the past that comes and goes intermittently due to moisture.  States it happened present more often while she was swimming.  This past few weeks, has been taking baths more frequently, and wonders if this could be causing it.  States usually after keeping area clean and dry, rash would resolve after 2 to 3 days.  However, current rash has continued despite keeping area clean and dry.  Denies spreading erythema, warmth.  Denies fever, chills, body aches.  Denies swelling, pain. Denies abdominal pain, nausea, vomiting, diarrhea.  Area is itching and sensation.  Has been cleaning with antibacterial soap.     Past Medical History:  Diagnosis Date  . Calcium oxalate renal stones    If if calcium   . History of varicella   . PCO (polycystic ovaries)    hair and irrg periods ? [place]  . Urinary frequency 11/07   no cause    Patient Active Problem List   Diagnosis Date Noted  . Palpitations 04/01/2017  . PCOS (polycystic ovarian syndrome) 07/31/2014  . Hx of urinary stone 07/31/2014  . Eyelid inflammation 01/14/2011  . Visit for preventive health examination 01/14/2011  . Folliculitis 10/08/2010  . Heel pain 05/20/2010  . Calcium oxalate renal stones   . Renal stone 04/18/2010  . OBESITY 08/17/2009  . POLYCYSTIC OVARIAN DISEASE 08/28/2008  . FREQUENCY, URINARY 08/25/2008  . CHICKENPOX, HX OF 08/25/2008    Past Surgical History:  Procedure Laterality Date  . TONSILLECTOMY    . TYMPANOSTOMY TUBE PLACEMENT      OB History    Gravida  0   Para  0   Term  0   Preterm  0   AB  0   Living  0     SAB  0   TAB  0   Ectopic  0   Multiple  0   Live  Births               Home Medications    Prior to Admission medications   Medication Sig Start Date End Date Taking? Authorizing Provider  acetaminophen (TYLENOL) 325 MG tablet Take 650 mg by mouth every 6 (six) hours as needed for mild pain, fever or headache.    [provider]  clotrimazole-betamethasone (LOTRISONE) cream Apply to affected area 2 times daily prn 01/23/19   Cathie Hoops, Amy V, PA-C  HYDROcodone-acetaminophen (NORCO/VICODIN) 5-325 MG tablet Take 1 tablet by mouth every 4 (four) hours as needed. 03/11/18   Bethel Born, PA-C  ibuprofen (ADVIL,MOTRIN) 600 MG tablet Take 1 tablet (600 mg total) by mouth every 6 (six) hours as needed. 03/11/18   Bethel Born, PA-C  ondansetron (ZOFRAN ODT) 4 MG disintegrating tablet Take 1 tablet (4 mg total) by mouth every 8 (eight) hours as needed for nausea or vomiting. 03/11/18   Bethel Born, PA-C  sertraline (ZOLOFT) 100 MG tablet Take 1 tablet daily by mouth. 11/17/16   [provider]    Family History Family History  Problem Relation Age of Onset  . Hypertension Mother   . Depression Mother   .  OCD Mother   . Bipolar disorder Mother   . Hypertension Father   . Breast cancer Maternal Grandmother     Social History Social History   Tobacco Use  . Smoking status: Never Smoker  . Smokeless tobacco: Never Used  Substance Use Topics  . Alcohol use: No  . Drug use: No     Allergies   Other   Review of Systems Review of Systems  Reason unable to perform ROS: See HPI as above.     Physical Exam Triage Vital Signs ED Triage Vitals  Enc Vitals Group     BP 01/23/19 0845 117/81     Pulse Rate 01/23/19 0845 72     Resp 01/23/19 0845 16     Temp 01/23/19 0845 97.7 F (36.5 C)     Temp Source 01/23/19 0845 Oral     SpO2 01/23/19 0845 98 %     Weight --      Height --      Head Circumference --      Peak Flow --      Pain Score 01/23/19 0856 0     Pain Loc --      Pain Edu? --       Excl. in [place]? --    No data found.  Updated Vital Signs BP 117/81 (BP Location: Right Arm)   Pulse 72   Temp 97.7 F (36.5 C) (Oral)   Resp 16   LMP 12/31/2018   SpO2 98%   Visual Acuity Right Eye Distance:   Left Eye Distance:   Bilateral Distance:    Right Eye Near:   Left Eye Near:    Bilateral Near:     Physical Exam Constitutional:      General: She is not in acute distress.    Appearance: She is well-developed. She is not diaphoretic.  HENT:     Head: Normocephalic and atraumatic.  Eyes:     Conjunctiva/sclera: Conjunctivae normal.     Pupils: Pupils are equal, round, and reactive to light.  Pulmonary:     Effort: Pulmonary effort is normal. No respiratory distress.  Abdominal:     Comments: Erythema along crease of umbilicus superiorly. No swelling, warmth. Using cotton swab, umbilicus was fully visualized without discharge, pain.   Skin:    General: Skin is warm and dry.  Neurological:     Mental Status: She is alert and oriented to person, place, and time.      UC Treatments / Results  Labs (all labs ordered are listed, but only abnormal results are displayed) Labs Reviewed - No data to display  EKG   Radiology No results found.  Procedures Procedures (including critical care time)  Medications Ordered in UC Medications - No data to display  Initial Impression / Assessment and Plan / UC Course  I have reviewed the triage vital signs and the nursing notes.  Pertinent labs & imaging results that were available during my care of the patient were reviewed by me and considered in my medical decision making (see chart for details).    ? Fungal infection given increase in moisture with itching. Will use lotrisone as directed. Keep clean and dry. Return precautions given. Patient expresses understanding and agrees to plan.  Final Clinical Impressions(s) / UC Diagnoses   Final diagnoses:  Rash   ED Prescriptions    Medication Sig Dispense Auth.  Provider   clotrimazole-betamethasone (LOTRISONE) cream Apply to affected area 2 times daily prn 15  g Belinda FisherYu, Amy V, PA-C     PDMP not reviewed this encounter.   Belinda FisherYu, Amy V, PA-C 01/23/19 [phone]

## 2019-01-23 NOTE — ED Triage Notes (Signed)
Rash near umbilical area that comes and goes. This time it has been present for a week. She is bathing more frequently that usual. Area is itchy and irritated.

## 2020-02-16 ENCOUNTER — Telehealth: Payer: Self-pay | Admitting: Nurse Practitioner

## 2020-02-16 ENCOUNTER — Other Ambulatory Visit: Payer: Self-pay

## 2020-02-16 NOTE — Progress Notes (Signed)
   Subjective:    Patient ID: Caroline Washington, female    DOB: Nov 24, 1990, 30 y.o.   MRN: [id number]  HPI  30 year old has been suffering from congestion and a runny nose. She had a rapid test at athletics today.   She is vaccinated with booster three weeks ago.      Review of Systems  Constitutional: Negative.   HENT: Positive for congestion and rhinorrhea.   Eyes: Negative.   Respiratory: Negative.   Cardiovascular: Negative.   Genitourinary: Negative.        Objective:   Physical Exam  This was a telehealth appointment with patient who was found to be in no acute distress during conversation        Assessment & Plan:  Per athletics rapid test is acceptable she will retest if she develops any additional symptoms or concerns  Offered PCR but patient declined at this time  She will continue to manage symptoms with OTC medications and RTC as needed for further concerns

## 2021-07-06 ENCOUNTER — Encounter: Payer: Self-pay | Admitting: Emergency Medicine

## 2021-07-06 ENCOUNTER — Ambulatory Visit
Admission: EM | Admit: 2021-07-06 | Discharge: 2021-07-06 | Disposition: A | Discharge status: Home / Self Care | Payer: 59 | Attending: Family Medicine | Admitting: Family Medicine

## 2021-07-06 ENCOUNTER — Other Ambulatory Visit: Payer: Self-pay

## 2021-07-06 DIAGNOSIS — L309 Dermatitis, unspecified: Secondary | ICD-10-CM | POA: Diagnosis not present

## 2021-07-06 MED ORDER — CLOTRIMAZOLE-BETAMETHASONE 1-0.05 % EX CREA: TOPICAL_CREAM | CUTANEOUS | 0 refills | Status: DC

## 2021-07-06 NOTE — ED Triage Notes (Signed)
Pt here rash around umbilical area that she normally uses steroid cream for; pt sts she is out and rash is bothering her

## 2021-07-06 NOTE — Discharge Instructions (Addendum)
Use the clotrimazole-betamethasone cream 2 times daily to the rash area till resolved.  This is actually a treatment for both yeast and for allergic dermatitis.

## 2021-07-06 NOTE — ED Provider Notes (Signed)
[facility]    CSN: [id number] Arrival date & time: 07/06/21  1003      History   Chief Complaint Chief Complaint  Patient presents with   Rash    HPI ARCADIA GORGAS is a 31 y.o. female.    Rash Here for rash at her belly button. Has had it off and on. Wanted to make sure it wasn't staph.  Past Medical History:  Diagnosis Date   Calcium oxalate renal stones    If if calcium    History of varicella    PCO (polycystic ovaries)    hair and irrg periods ? [place]   Urinary frequency 11/07   no cause    Patient Active Problem List   Diagnosis Date Noted   Palpitations 04/01/2017   PCOS (polycystic ovarian syndrome) 07/31/2014   Hx of urinary stone 07/31/2014   Eyelid inflammation 01/14/2011   Visit for preventive health examination 01/14/2011   Folliculitis 10/08/2010   Heel pain 05/20/2010   Calcium oxalate renal stones    Renal stone 04/18/2010   OBESITY 08/17/2009   POLYCYSTIC OVARIAN DISEASE 08/28/2008   FREQUENCY, URINARY 08/25/2008   CHICKENPOX, HX OF 08/25/2008    Past Surgical History:  Procedure Laterality Date   TONSILLECTOMY     TYMPANOSTOMY TUBE PLACEMENT      OB History     Gravida  0   Para  0   Term  0   Preterm  0   AB  0   Living  0      SAB  0   IAB  0   Ectopic  0   Multiple  0   Live Births               Home Medications    Prior to Admission medications   Medication Sig Start Date End Date Taking? Authorizing Provider  acetaminophen (TYLENOL) 325 MG tablet Take 650 mg by mouth every 6 (six) hours as needed for mild pain, fever or headache.    [provider]  clotrimazole-betamethasone (LOTRISONE) cream Apply to affected area 2 times daily till better 07/06/21   Zenia Resides, MD  ibuprofen (ADVIL,MOTRIN) 600 MG tablet Take 1 tablet (600 mg total) by mouth every 6 (six) hours as needed. 03/11/18   Bethel Born, PA-C  ondansetron (ZOFRAN ODT) 4 MG disintegrating tablet Take 1  tablet (4 mg total) by mouth every 8 (eight) hours as needed for nausea or vomiting. 03/11/18   Bethel Born, PA-C  sertraline (ZOLOFT) 100 MG tablet Take 1 tablet daily by mouth. 11/17/16   [provider]    Family History Family History  Problem Relation Age of Onset   Hypertension Mother    Depression Mother    OCD Mother    Bipolar disorder Mother    Hypertension Father    Breast cancer Maternal Grandmother     Social History Social History   Tobacco Use   Smoking status: Never   Smokeless tobacco: Never  Vaping Use   Vaping Use: Never used  Substance Use Topics   Alcohol use: No   Drug use: No     Allergies   Other   Review of Systems Review of Systems  Skin:  Positive for rash.    Physical Exam Triage Vital Signs ED Triage Vitals  Enc Vitals Group     BP 07/06/21 1122 (!) 169/110     Pulse Rate 07/06/21 1122 88  Resp 07/06/21 1122 18     Temp 07/06/21 1122 97.9 F (36.6 C)     Temp Source 07/06/21 1122 Oral     SpO2 07/06/21 1122 98 %     Weight --      Height --      Head Circumference --      Peak Flow --      Pain Score 07/06/21 1123 0     Pain Loc --      Pain Edu? --      Excl. in GC? --    No data found.  Updated Vital Signs BP (!) 169/110 (BP Location: Left Arm)   Pulse 88   Temp 97.9 F (36.6 C) (Oral)   Resp 18   SpO2 98%   Visual Acuity Right Eye Distance:   Left Eye Distance:   Bilateral Distance:    Right Eye Near:   Left Eye Near:    Bilateral Near:     Physical Exam Vitals reviewed.  Constitutional:      General: She is not in acute distress.    Appearance: She is not toxic-appearing.  Skin:    Comments: There is a faintly pink rash she area on the right aspect of the umbilicus.  There is no induration or ulceration.  There is also no discharge  Neurological:     Mental Status: She is alert and oriented to person, place, and time.  Psychiatric:        Behavior: Behavior normal.     UC  Treatments / Results  Labs (all labs ordered are listed, but only abnormal results are displayed) Labs Reviewed - No data to display  EKG   Radiology No results found.  Procedures Procedures (including critical care time)  Medications Ordered in UC Medications - No data to display  Initial Impression / Assessment and Plan / UC Course  I have reviewed the triage vital signs and the nursing notes.  Pertinent labs & imaging results that were available during my care of the patient were reviewed by me and considered in my medical decision making (see chart for details).      Final Clinical Impressions(s) / UC Diagnoses   Final diagnoses:  Dermatitis     Discharge Instructions      ItUse the clotrimazole-betamethasone cream 2 times daily to the rash area till resolved.  This is actually a treatment for both yeast and for allergic dermatitis.     ED Prescriptions     Medication Sig Dispense Auth. Provider   clotrimazole-betamethasone (LOTRISONE) cream Apply to affected area 2 times daily till better 15 g Marlinda Mike Janace Aris, MD      PDMP not reviewed this encounter.   Zenia Resides, MD 07/06/21 1134

## 2022-03-25 ENCOUNTER — Other Ambulatory Visit: Payer: Self-pay | Admitting: Obstetrics and Gynecology

## 2022-03-25 DIAGNOSIS — N6310 Unspecified lump in the right breast, unspecified quadrant: Secondary | ICD-10-CM

## 2022-04-03 ENCOUNTER — Ambulatory Visit
Admission: RE | Admit: 2022-04-03 | Discharge: 2022-04-03 | Disposition: A | Discharge status: Home / Self Care | Payer: Commercial Managed Care - HMO | Source: Ambulatory Visit | Attending: Obstetrics and Gynecology | Admitting: Obstetrics and Gynecology

## 2022-04-03 ENCOUNTER — Ambulatory Visit
Admission: RE | Admit: 2022-04-03 | Discharge: 2022-04-03 | Disposition: A | Discharge status: Home / Self Care | Payer: 59 | Source: Ambulatory Visit | Attending: Obstetrics and Gynecology | Admitting: Obstetrics and Gynecology

## 2022-04-03 ENCOUNTER — Other Ambulatory Visit: Payer: Self-pay | Admitting: Obstetrics and Gynecology

## 2022-04-03 DIAGNOSIS — N6310 Unspecified lump in the right breast, unspecified quadrant: Secondary | ICD-10-CM

## 2022-10-06 ENCOUNTER — Ambulatory Visit: Admission: RE | Admit: 2022-10-06 | Payer: Commercial Managed Care - HMO | Source: Ambulatory Visit

## 2022-10-06 ENCOUNTER — Ambulatory Visit
Admission: RE | Admit: 2022-10-06 | Discharge: 2022-10-06 | Disposition: A | Discharge status: Home / Self Care | Payer: Commercial Managed Care - HMO | Source: Ambulatory Visit | Attending: Obstetrics and Gynecology | Admitting: Obstetrics and Gynecology

## 2022-10-06 ENCOUNTER — Other Ambulatory Visit: Payer: Self-pay | Admitting: Obstetrics and Gynecology

## 2022-10-06 DIAGNOSIS — N6310 Unspecified lump in the right breast, unspecified quadrant: Secondary | ICD-10-CM

## 2023-04-09 DIAGNOSIS — Z01419 Encounter for gynecological examination (general) (routine) without abnormal findings: Secondary | ICD-10-CM | POA: Diagnosis not present

## 2023-04-09 DIAGNOSIS — Z6841 Body Mass Index (BMI) 40.0 and over, adult: Secondary | ICD-10-CM | POA: Diagnosis not present

## 2023-04-23 ENCOUNTER — Ambulatory Visit
Admission: RE | Admit: 2023-04-23 | Discharge: 2023-04-23 | Disposition: A | Discharge status: Home / Self Care | Payer: Commercial Managed Care - HMO | Source: Ambulatory Visit | Attending: Obstetrics and Gynecology | Admitting: Obstetrics and Gynecology

## 2023-04-23 ENCOUNTER — Other Ambulatory Visit: Payer: Self-pay | Admitting: Obstetrics and Gynecology

## 2023-04-23 DIAGNOSIS — N6342 Unspecified lump in left breast, subareolar: Secondary | ICD-10-CM | POA: Diagnosis not present

## 2023-04-23 DIAGNOSIS — N6315 Unspecified lump in the right breast, overlapping quadrants: Secondary | ICD-10-CM | POA: Diagnosis not present

## 2023-04-23 DIAGNOSIS — N6310 Unspecified lump in the right breast, unspecified quadrant: Secondary | ICD-10-CM

## 2023-04-27 ENCOUNTER — Ambulatory Visit
Admission: RE | Admit: 2023-04-27 | Discharge: 2023-04-27 | Disposition: A | Discharge status: Home / Self Care | Source: Ambulatory Visit | Attending: Obstetrics and Gynecology | Admitting: Obstetrics and Gynecology

## 2023-04-27 DIAGNOSIS — N6011 Diffuse cystic mastopathy of right breast: Secondary | ICD-10-CM | POA: Diagnosis not present

## 2023-04-27 DIAGNOSIS — R92321 Mammographic fibroglandular density, right breast: Secondary | ICD-10-CM | POA: Diagnosis not present

## 2023-04-27 DIAGNOSIS — N6310 Unspecified lump in the right breast, unspecified quadrant: Secondary | ICD-10-CM

## 2023-04-27 DIAGNOSIS — N6315 Unspecified lump in the right breast, overlapping quadrants: Secondary | ICD-10-CM | POA: Diagnosis not present

## 2023-04-27 HISTORY — PX: BREAST BIOPSY: SHX20

## 2023-04-28 ENCOUNTER — Other Ambulatory Visit

## 2023-04-28 LAB — SURGICAL PATHOLOGY

## 2023-05-05 ENCOUNTER — Encounter: Payer: Self-pay | Admitting: Family Medicine

## 2023-05-05 ENCOUNTER — Ambulatory Visit: Payer: Commercial Managed Care - HMO | Admitting: Family Medicine

## 2023-05-05 VITALS — BP 130/80 | HR 67 | Temp 98.1°F | Resp 18 | Ht 69.0 in | Wt 316.2 lb

## 2023-05-05 DIAGNOSIS — Z0000 Encounter for general adult medical examination without abnormal findings: Secondary | ICD-10-CM

## 2023-05-05 DIAGNOSIS — G43109 Migraine with aura, not intractable, without status migrainosus: Secondary | ICD-10-CM | POA: Diagnosis not present

## 2023-05-05 DIAGNOSIS — R1011 Right upper quadrant pain: Secondary | ICD-10-CM

## 2023-05-05 DIAGNOSIS — Z23 Encounter for immunization: Secondary | ICD-10-CM

## 2023-05-05 DIAGNOSIS — J4520 Mild intermittent asthma, uncomplicated: Secondary | ICD-10-CM

## 2023-05-05 DIAGNOSIS — E282 Polycystic ovarian syndrome: Secondary | ICD-10-CM

## 2023-05-05 LAB — CBC WITH DIFFERENTIAL/PLATELET
Basophils Absolute: 0 10*3/uL (ref 0.0–0.1)
Basophils Relative: 0.7 % (ref 0.0–3.0)
Eosinophils Absolute: 0.1 10*3/uL (ref 0.0–0.7)
Eosinophils Relative: 1.3 % (ref 0.0–5.0)
HCT: 41.6 % (ref 36.0–46.0)
Hemoglobin: 13.3 g/dL (ref 12.0–15.0)
Lymphocytes Relative: 38.8 % (ref 12.0–46.0)
Lymphs Abs: 2.3 10*3/uL (ref 0.7–4.0)
MCHC: 31.9 g/dL (ref 30.0–36.0)
MCV: 78 fl (ref 78.0–100.0)
Monocytes Absolute: 0.3 10*3/uL (ref 0.1–1.0)
Monocytes Relative: 4.3 % (ref 3.0–12.0)
Neutro Abs: 3.3 10*3/uL (ref 1.4–7.7)
Neutrophils Relative %: 54.9 % (ref 43.0–77.0)
Platelets: 275 10*3/uL (ref 150.0–400.0)
RBC: 5.33 Mil/uL — ABNORMAL HIGH (ref 3.87–5.11)
RDW: 15.5 % (ref 11.5–15.5)
WBC: 6 10*3/uL (ref 4.0–10.5)

## 2023-05-05 LAB — LIPID PANEL
Cholesterol: 186 mg/dL (ref 0–200)
HDL: 50.7 mg/dL (ref >39.00)
LDL Cholesterol: 111 mg/dL — ABNORMAL HIGH (ref 0–99)
NonHDL: 135.42
Total CHOL/HDL Ratio: 4
Triglycerides: 122 mg/dL (ref 0.0–149.0)
VLDL: 24.4 mg/dL (ref 0.0–40.0)

## 2023-05-05 LAB — COMPREHENSIVE METABOLIC PANEL
ALT: 10 U/L (ref 0–35)
AST: 12 U/L (ref 0–37)
Albumin: 4.5 g/dL (ref 3.5–5.2)
Alkaline Phosphatase: 62 U/L (ref 39–117)
BUN: 11 mg/dL (ref 6–23)
CO2: 28 meq/L (ref 19–32)
Calcium: 9.8 mg/dL (ref 8.4–10.5)
Chloride: 102 meq/L (ref 96–112)
Creatinine, Ser: 0.7 mg/dL (ref 0.40–1.20)
GFR: 115.04 mL/min (ref >60.00)
Glucose, Bld: 97 mg/dL (ref 70–99)
Potassium: 3.9 meq/L (ref 3.5–5.1)
Sodium: 137 meq/L (ref 135–145)
Total Bilirubin: 0.3 mg/dL (ref 0.2–1.2)
Total Protein: 7.6 g/dL (ref 6.0–8.3)

## 2023-05-05 LAB — HEMOGLOBIN A1C: Hgb A1c MFr Bld: 5.8 % (ref 4.6–6.5)

## 2023-05-05 LAB — TSH: TSH: 1.93 u[IU]/mL (ref 0.35–5.50)

## 2023-05-05 NOTE — Progress Notes (Signed)
 Labs ok except: 1.  A1C(3 month average of sugars) is elevated.  This is considered PreDiabetes.  Work on diet-decrease sugars and starches and aim for 30 minutes of exercise 5 days/week to prevent progression to diabetes.  Will re-eval in Sept.

## 2023-05-05 NOTE — Progress Notes (Signed)
 New Patient Office Visit  Subjective:  Patient ID: Caroline Washington, female    DOB: 1990/09/29  Age: 33 y.o. MRN: [id number]  CC:  Chief Complaint  Patient presents with   Establish Care    Initial visit to establish care with new pcp Fasting, coffee only     HPI Caroline Washington presents for new pt - [facility] Office mgr  Discussed the use of AI scribe software for clinical note transcription with the patient, who gave verbal consent to proceed.  History of Present Illness Caroline Washington is a 33 year old female who presents for establishment of primary care.  She recently traveled to [facility] and [facility] in February, engaging in extensive hiking. Upon returning, she experienced a low-grade headache and elevated blood pressure of 167/90, which she attributed to dehydration. After hydrating, her blood pressure normalized to 120/70. She has not had a primary care visit in seven years and decided to establish care.  She has a history of migraines since late elementary school, with current episodes occurring every two to three months. Migraines are characterized by an aura in the top corner of her right eye. She manages them with sleep and ibuprofen, as acetaminophen is ineffective. She has not used sumatriptan or similar medications recently due to a preference to avoid medication unless necessary.  She experiences right upper quadrant pain, particularly after consuming fatty foods, which she suspects is related to gallbladder issues. This pain is consistent with her family history, as no one over 40 on her mother's side has a gallbladder. She notes an increase in pain after trying semaglutide last year.  She has a history of preatrial contractions, which she can feel when they occur, often triggered by caffeine or increased activity. She has consulted a cardiologist who was not concerned, and she does not experience dizziness or lightheadedness with these episodes.  She has asthma and uses  albuterol only when sick, which is infrequent. She had a significant illness two years ago with influenza and COVID-19, requiring urgent care.  She has anxiety, managed with sertraline, which she reports works well. No depression or suicidal thoughts.  She has a history of PCOS and is aware of the associated risk of diabetes. She is working on diet and exercise for weight management. She experienced increased gallbladder pain after using semaglutide for weight loss last year.  She reports frequent urination, which she attributes to a small bladder capacity. No issues with urination.     Current Outpatient Medications:    albuterol (VENTOLIN HFA) 108 (90 Base) MCG/ACT inhaler, INHALE 2 PUFFS BY MOUTH EVERY 6 HOURS AS NEEDED FOR WHEEZING, Disp: , Rfl:    sertraline (ZOLOFT) 100 MG tablet, Take 1 tablet by mouth at bedtime., Disp: , Rfl:   Past Medical History:  Diagnosis Date   Allergy    Anxiety    Asthma    Calcium oxalate renal stones    If if calcium    Heart murmur    History of varicella    PCO (polycystic ovaries)    hair and irrg periods ? [place]   Urinary frequency 12/11/2005   no cause    Past Surgical History:  Procedure Laterality Date   BREAST BIOPSY Right 04/27/2023   [place] RT BREAST BX W LOC DEV 1ST LESION IMG BX SPEC US GUIDE 04/27/2023 [facility]   TONSILLECTOMY  2018   TYMPANOSTOMY TUBE PLACEMENT      Family History  Problem Relation Age of  Onset   Hyperlipidemia Mother    Hearing loss Mother    Arthritis Mother    Hypertension Mother    Depression Mother    OCD Mother    Bipolar disorder Mother    Hyperlipidemia Father    Arthritis Father    Alcohol abuse Father    Hypertension Father    Depression Maternal Grandmother    Cancer Maternal Grandmother    Arthritis Maternal Grandmother    Breast cancer Maternal Grandmother    Stroke Maternal Grandfather    Hearing loss Maternal Grandfather    Hypertension Paternal Grandmother    Heart disease  Paternal Grandmother    Hyperlipidemia Paternal Grandmother    Cancer Paternal Grandmother    Arthritis Paternal Grandmother    Kidney disease Paternal Grandfather    Hypertension Paternal Grandfather    Hyperlipidemia Paternal Grandfather    Heart disease Paternal Grandfather    Hearing loss Paternal Grandfather    Cancer Paternal Grandfather    Arthritis Paternal Grandfather     Social History   Socioeconomic History   Marital status: Single    Spouse name: Not on file   Number of children: 0   Years of education: Not on file   Highest education level: Not on file  Occupational History   Not on file  Tobacco Use   Smoking status: Never   Smokeless tobacco: Never  Vaping Use   Vaping status: Never Used  Substance and Sexual Activity   Alcohol use: No   Drug use: No   Sexual activity: Not Currently    Birth control/protection: None  Other Topics Concern   Not on file  Social History Narrative   Father 40'10'   Mother 5'9"   Caffeine: Coke twice weekly and coffee everyday.   HH of 3   2 cats and 3 dogs   Parents: [name] and Rayvon Char   Sleep 7 hours      [employer] .     Graduated recently from [employer]   Looking into PT school   Social Drivers of Health   Financial Resource Strain: Not on file  Food Insecurity: Not on file  Transportation Needs: Not on file  Physical Activity: Not on file  Stress: Not on file  Social Connections: Unknown (06/22/2021)   Received from [facility]   Social Network    Social Network: Not on file  Intimate Partner Violence: Unknown (05/16/2021)   Received from [employer], [facility]   HITS    Physically Hurt: Not on file    Insult or Talk Down To: Not on file    Threaten Physical Harm: Not on file    Scream or Curse: Not on file    ROS  ROS: Gen: no fever, chills  Skin: no rash, itching ENT: no ear pain, ear drainage, nasal congestion, rhinorrhea, sinus pressure, sore throat Eyes:  no blurry vision, double vision Resp: no cough, wheeze,SOB CV: no CP, palpitations, LE edema,   occ pac-saw card GI: no heartburn, n/v/d/c, abd pain GU: no dysuria, urgency, frequency, hematuria.  Dr. Henderson Cloud gyn MSK: no joint pain, myalgias, back pain Neuro: no dizziness,, weakness, vertigo.  H/o migraine-aura top R eye.  About every 2 months.  Psych: no depression, insomnia, SI   Objective:   Today's Vitals: BP 130/80   Pulse 67   Temp 98.1 F (36.7 C) (Temporal)   Resp 18   Ht 5\' 9"  (1.753 m)   Wt Marland Kitchen)  316 lb 4 oz (143.5 kg)   LMP 04/30/2023 (Within Days)   SpO2 98%   BMI 46.70 kg/m   Physical Exam  Gen: WDWN NAD HEENT: NCAT, conjunctiva not injected, sclera nonicteric TM WNL B, OP moist, no exudates  NECK:  supple, no thyromegaly, no nodes, no carotid bruits CARDIAC: RRR, S1S2+, no murmur. DP 2+B LUNGS: CTAB. No wheezes ABDOMEN:  BS+, soft, NTND, No HSM, no masses EXT:  no edema MSK: no gross abnormalities.  NEURO: A&O x3.  CN II-XII intact.  PSYCH: normal mood. Good eye contact   Assessment & Plan:  Wellness examination -     Lipid panel -     Comprehensive metabolic panel -     CBC with Differential/Platelet -     Hemoglobin A1c -     TSH  RUQ pain -     US ABDOMEN LIMITED RUQ (LIVER/GB); Future -     Ambulatory referral to General Surgery  Need for tetanus booster -     Tdap vaccine greater than or equal to 7yo IM  Migraine with aura and without status migrainosus, not intractable  PCOS (polycystic ovarian syndrome)  Mild intermittent asthma without complication   Wellness-anticipatory guidance.  Work on Diet/Exercise  Check CBC,CMP,lipids,TSH, A1C.  F/u 1 yr  Assessment and Plan Assessment & Plan Gallbladder Pain   She experiences intermittent right upper quadrant pain after consuming fatty foods, indicating possible gallbladder issues. With a family history of gallbladder disease requiring emergency surgery, she prefers elective management. We  discussed the benefits of elective cholecystectomy and dietary changes to manage symptoms. An abdominal ultrasound is ordered to check for gallstones or other gallbladder issues. She will be referred to a general surgeon for evaluation and potential elective cholecystectomy. Advised to avoid greasy, fatty foods to prevent symptom exacerbation.  Migraine with Aura   She experiences migraines with aura every two to three months, with visual disturbances in the top corner of the right eye. Preferring non-pharmacological management, she uses ibuprofen and sleep for relief. Aware of the increased stroke risk with estrogen-containing contraceptives, she avoids them. We discussed potential triggers and non-estrogen contraceptive options. Advised to track potential migraine triggers such as dehydration, caffeine, and dietary factors.  Polycystic Ovary Syndrome (PCOS)   PCOS presents an increased diabetes risk. She previously tried semaglutide, which worsened gallbladder pain, leading to its discontinuation. We discussed the importance of monitoring blood glucose and maintaining a healthy lifestyle to manage PCOS and reduce diabetes risk. Blood glucose levels will be monitored, and she is encouraged to manage weight and maintain a healthy lifestyle.  Anxiety   Her anxiety is managed with sertraline, with symptom improvement following a career change. She prefers to avoid additional medications unless necessary.  Asthma   Asthma is managed with an albuterol inhaler as needed, primarily when ill. No recent exacerbations reported.  General Health Maintenance   She is generally healthy with regular exercise and a healthy lifestyle. Her last cholesterol check was normal. Tetanus vaccination status is uncertain and likely overdue. We discussed the importance of regular health maintenance, including vaccinations and screenings. A tetanus booster vaccine will be administered. She is encouraged to maintain regular  physical activity, a healthy diet, routine dental care, and use sunscreen.  Follow-up   She prefers regular follow-up due to potential gallbladder surgery and monitoring of blood glucose levels. A six-month follow-up is scheduled to monitor health status and discuss any updates on gallbladder management.    Follow-up: Return in about  6 months (around 11/05/2023) for f/u.   Angelena Sole, MD

## 2023-05-05 NOTE — Patient Instructions (Addendum)
 Welcome to [facility]! It was a pleasure meeting you today.  As discussed, Please schedule a 6 month follow up visit today.  [facility] [address]. [place], [place] [phone]   PLEASE NOTE:  If you had any LAB tests please let us know if you have not heard back within a few days. You may see your results on MyChart before we have a chance to review them but we will give you a call once they are reviewed by [place]. If we ordered any REFERRALS today, please let us know if you have not heard from their office within the next week.  Let us know through MyChart if you are needing REFILLS, or have your pharmacy send [place] the request. You can also use MyChart to communicate with me or any office staff.  Please try these tips to maintain a healthy lifestyle:  Eat most of your calories during the day when you are active. Eliminate processed foods including packaged sweets (pies, cakes, cookies), reduce intake of potatoes, white bread, white pasta, and white rice. Look for whole grain options, oat flour or almond flour.  Each meal should contain half fruits/vegetables, one quarter protein, and one quarter carbs (no bigger than a computer mouse).  Cut down on sweet beverages. This includes juice, soda, and sweet tea. Also watch fruit intake, though this is a healthier sweet option, it still contains natural sugar! Limit to 3 servings daily.  Drink at least 1 glass of water with each meal and aim for at least 8 glasses per day  Exercise at least 150 minutes every week.

## 2023-05-14 ENCOUNTER — Ambulatory Visit
Admission: RE | Admit: 2023-05-14 | Discharge: 2023-05-14 | Disposition: A | Discharge status: Home / Self Care | Source: Ambulatory Visit | Attending: Family Medicine | Admitting: Family Medicine

## 2023-05-14 ENCOUNTER — Encounter: Payer: Self-pay | Admitting: Family Medicine

## 2023-05-14 DIAGNOSIS — R1011 Right upper quadrant pain: Secondary | ICD-10-CM

## 2023-05-14 DIAGNOSIS — R109 Unspecified abdominal pain: Secondary | ICD-10-CM | POA: Diagnosis not present

## 2023-05-14 NOTE — Progress Notes (Signed)
 U/s negative for stones.  We can order HIDA scan as may not be functioning properly.

## 2023-11-05 ENCOUNTER — Ambulatory Visit: Admitting: Family Medicine

## 2024-01-28 ENCOUNTER — Ambulatory Visit: Admitting: Family Medicine

## 2024-02-18 ENCOUNTER — Other Ambulatory Visit: Payer: Self-pay | Admitting: Obstetrics and Gynecology

## 2024-02-18 DIAGNOSIS — N6320 Unspecified lump in the left breast, unspecified quadrant: Secondary | ICD-10-CM

## 2024-04-25 ENCOUNTER — Encounter

## 2024-04-25 ENCOUNTER — Other Ambulatory Visit
